# Patient Record
Sex: Female | Born: 1950 | Race: White | Hispanic: No | Marital: Married | Smoking: Never smoker
Health system: Southern US, Community
[De-identification: ages and names within clinical notes are randomized; demographics above are authoritative.]

## PROBLEM LIST (undated history)

## (undated) DIAGNOSIS — R112 Nausea with vomiting, unspecified: Secondary | ICD-10-CM

## (undated) DIAGNOSIS — Z973 Presence of spectacles and contact lenses: Secondary | ICD-10-CM

## (undated) DIAGNOSIS — Z9889 Other specified postprocedural states: Secondary | ICD-10-CM

## (undated) DIAGNOSIS — L570 Actinic keratosis: Secondary | ICD-10-CM

## (undated) DIAGNOSIS — E119 Type 2 diabetes mellitus without complications: Secondary | ICD-10-CM

## (undated) DIAGNOSIS — N840 Polyp of corpus uteri: Secondary | ICD-10-CM

## (undated) DIAGNOSIS — N950 Postmenopausal bleeding: Secondary | ICD-10-CM

## (undated) DIAGNOSIS — E785 Hyperlipidemia, unspecified: Secondary | ICD-10-CM

## (undated) HISTORY — DX: Hyperlipidemia, unspecified: E78.5

## (undated) HISTORY — DX: Actinic keratosis: L57.0

---

## 1978-03-29 HISTORY — PX: LAPAROSCOPY: SHX197

## 2000-08-24 ENCOUNTER — Other Ambulatory Visit: Admission: RE | Admit: 2000-08-24 | Discharge: 2000-08-24 | Payer: Self-pay | Admitting: Internal Medicine

## 2000-09-27 LAB — HM COLONOSCOPY: HM Colonoscopy: NORMAL

## 2003-02-15 ENCOUNTER — Other Ambulatory Visit: Admission: RE | Admit: 2003-02-15 | Discharge: 2003-02-15 | Payer: Self-pay | Admitting: Internal Medicine

## 2004-06-10 ENCOUNTER — Ambulatory Visit: Payer: Self-pay | Admitting: Internal Medicine

## 2004-12-04 ENCOUNTER — Ambulatory Visit: Payer: Self-pay | Admitting: Internal Medicine

## 2005-02-04 ENCOUNTER — Ambulatory Visit: Payer: Self-pay | Admitting: Family Medicine

## 2006-06-16 ENCOUNTER — Encounter: Payer: Self-pay | Admitting: Internal Medicine

## 2006-06-16 ENCOUNTER — Ambulatory Visit: Payer: Self-pay | Admitting: Internal Medicine

## 2006-06-16 ENCOUNTER — Other Ambulatory Visit: Admission: RE | Admit: 2006-06-16 | Discharge: 2006-06-16 | Payer: Self-pay | Admitting: Internal Medicine

## 2006-06-16 LAB — CONVERTED CEMR LAB
Cholesterol: 175 mg/dL (ref 0–200)
HDL: 32.8 mg/dL — ABNORMAL LOW (ref >39.0)
Triglycerides: 136 mg/dL (ref 0–149)
VLDL: 27 mg/dL (ref 0–40)

## 2006-06-21 ENCOUNTER — Ambulatory Visit: Payer: Self-pay | Admitting: Family Medicine

## 2006-07-01 ENCOUNTER — Ambulatory Visit: Payer: Self-pay | Admitting: Internal Medicine

## 2006-07-01 ENCOUNTER — Encounter: Payer: Self-pay | Admitting: Internal Medicine

## 2006-07-01 DIAGNOSIS — N809 Endometriosis, unspecified: Secondary | ICD-10-CM | POA: Insufficient documentation

## 2008-05-09 ENCOUNTER — Ambulatory Visit: Payer: Self-pay | Admitting: Family Medicine

## 2008-05-09 DIAGNOSIS — H8110 Benign paroxysmal vertigo, unspecified ear: Secondary | ICD-10-CM

## 2008-11-01 ENCOUNTER — Ambulatory Visit: Payer: Self-pay | Admitting: Internal Medicine

## 2008-11-01 DIAGNOSIS — E785 Hyperlipidemia, unspecified: Secondary | ICD-10-CM | POA: Insufficient documentation

## 2008-11-04 LAB — CONVERTED CEMR LAB
ALT: 19 units/L (ref 0–35)
Albumin: 4 g/dL (ref 3.5–5.2)
BUN: 11 mg/dL (ref 6–23)
Basophils Absolute: 0 10*3/uL (ref 0.0–0.1)
Basophils Relative: 0.2 % (ref 0.0–3.0)
Bilirubin, Direct: 0.2 mg/dL (ref 0.0–0.3)
Chloride: 108 meq/L (ref 96–112)
Cholesterol: 188 mg/dL (ref 0–200)
Eosinophils Absolute: 0.1 10*3/uL (ref 0.0–0.7)
Eosinophils Relative: 1.9 % (ref 0.0–5.0)
HCT: 41.9 % (ref 36.0–46.0)
HDL: 43.7 mg/dL (ref >39.00)
Hemoglobin: 14.2 g/dL (ref 12.0–15.0)
Hgb A1c MFr Bld: 5.8 % (ref 4.6–6.5)
LDL Cholesterol: 126 mg/dL — ABNORMAL HIGH (ref 0–99)
MCV: 92.4 fL (ref 78.0–100.0)
Monocytes Relative: 7.8 % (ref 3.0–12.0)
Neutro Abs: 4.4 10*3/uL (ref 1.4–7.7)
Phosphorus: 2.7 mg/dL (ref 2.3–4.6)
Platelets: 251 10*3/uL (ref 150.0–400.0)
Potassium: 4.3 meq/L (ref 3.5–5.1)
RBC: 4.53 M/uL (ref 3.87–5.11)
Sodium: 144 meq/L (ref 135–145)
Total Protein: 7 g/dL (ref 6.0–8.3)
Triglycerides: 91 mg/dL (ref 0.0–149.0)
VLDL: 18.2 mg/dL (ref 0.0–40.0)
WBC: 7.1 10*3/uL (ref 4.5–10.5)

## 2008-11-05 ENCOUNTER — Encounter: Payer: Self-pay | Admitting: Internal Medicine

## 2008-11-21 ENCOUNTER — Encounter: Payer: Self-pay | Admitting: Internal Medicine

## 2009-06-06 ENCOUNTER — Ambulatory Visit: Payer: Self-pay | Admitting: Internal Medicine

## 2009-06-06 DIAGNOSIS — H66009 Acute suppurative otitis media without spontaneous rupture of ear drum, unspecified ear: Secondary | ICD-10-CM | POA: Insufficient documentation

## 2009-12-25 ENCOUNTER — Telehealth: Payer: Self-pay | Admitting: Internal Medicine

## 2010-04-28 NOTE — Progress Notes (Signed)
Summary: exposed to scabies  Phone Note Call from Patient Call back at [phone]   Caller: Patient Call For: Cindee Salt MD Summary of Call: Pt states her grandson, who lives with her, was dx'd with scabies today and his doctor told her all the family would need to be treated.  Uses [facility] [address]  Please also see phone notes on her husband and son. Initial call taken by: Lowella Petties CMA,  December 25, 2009 3:34 PM  Follow-up for Phone Call        okay   Rx sent  please make sure they wash all clothes, linens and towels in hot water the day after they do the treatment Follow-up by: Cindee Salt MD,  December 25, 2009 3:41 PM  Additional Follow-up for Phone Call Additional follow up Details #1::        Patient notified as instructed via telephone.  Rx sent to pharmacy. Additional Follow-up by: Linde Gillis CMA Duncan Dull),  December 25, 2009 5:10 PM    New/Updated Medications: PERMETHRIN 5 % CREA (PERMETHRIN) apply to entire body from neck down in evening and wash off the next day Prescriptions: PERMETHRIN 5 % CREA (PERMETHRIN) apply to entire body from neck down in evening and wash off the next day  #1 bottle x 0   Entered and Authorized by:   Cindee Salt MD   Signed by:   Cindee Salt MD on 12/25/2009   Method used:   Electronically to        [facility]. [address] [phone]* (retail)       [address] [place], [place]  [id number]       Ph: [phone]       Fax: [phone]   RxID:   [id number]

## 2010-04-28 NOTE — Assessment & Plan Note (Signed)
Summary: EAR ACHE/DLO   Vital Signs:  Patient profile:   60 year old female Weight:      183 pounds Temp:     98.2 degrees F oral BP sitting:   128 / 78  (left arm) Cuff size:   regular  Vitals Entered By: Mervin Hack CMA Duncan Dull) (June 06, 2009 12:26 PM) CC: right ear pain   History of Present Illness: Awoke a week ago with bad sore throat 4 days ago-only left side of throat hurt and voice went next day, pain to right side and up into ear draining--looks like wax  Pain is intermittent now--sharp some ringing still Hearing is really off there  Throat is now better--slight discomfort No sig head congestion Fever on first day only  sporadic cough No SOB  Allergies: No Known Drug Allergies  Past History:  Past medical, surgical, family and social histories (including risk factors) reviewed for relevance to current acute and chronic problems.  Past Medical History: Reviewed history from 11/01/2008 and no changes required. Endometriosis Impaired fasting glucose Hyperlipidemia  Past Surgical History: Reviewed history from 11/01/2008 and no changes required. VD x 2 Laparascopy --1980's  Family History: Reviewed history from 11/01/2008 and no changes required. Dad had  DM and HTN. Died of ?melanoma Mom with ulcers. Skin cancer Mat GM died of stroke in her 70's Mat GF died of MI in his 11's No breast cancer ??sister with colon cancer (in polyp)  Social History: Reviewed history from 11/01/2008 and no changes required. Married---2 children---1 son and 1 daughter Never Smoked Alcohol use-occ Occupation: Chief Financial Officer for [employer]  Review of Systems       No vomiting or diarrhea appetite only off the first day  Physical Exam  General:  alert.  NAD Head:  no sinus tenderness Ears:  L ear normal.   small canals--right TM has inflammation and dullness posteriorly Nose:  mild congestion with yellow mucus Mouth:  no erythema and no exudates.     Neck:  supple, no masses, and no cervical lymphadenopathy.   Lungs:  normal respiratory effort and normal breath sounds.     Impression & Recommendations:  Problem # 1:  ACUT SUPPRATV OTITIS MEDIA W/O SPONT RUP EARDRUM (ICD-382.00) Assessment New  discussed analgesics amoxicillin will change to augmentin if persists  Her updated medication list for this problem includes:    Amoxicillin 500 Mg Tabs (Amoxicillin) .Marland Kitchen... 2 tabs by mouth two times a day for ear infection  Complete Medication List: 1)  Amoxicillin 500 Mg Tabs (Amoxicillin) .... 2 tabs by mouth two times a day for ear infection  Patient Instructions: 1)  Please schedule a follow-up appointment as needed .  Prescriptions: AMOXICILLIN 500 MG TABS (AMOXICILLIN) 2 tabs by mouth two times a day for ear infection  #40 x 0   Entered and Authorized by:   Cindee Salt MD   Signed by:   Cindee Salt MD on 06/06/2009   Method used:   Electronically to        [facility]. [address] [phone]* (retail)       [address] [place], [place]  [id number]       Ph: [phone]       Fax: [phone]   RxID:   [id number]   Prior Medications: Current Allergies (reviewed today): No known allergies

## 2010-12-18 ENCOUNTER — Encounter: Payer: Self-pay | Admitting: Internal Medicine

## 2010-12-24 ENCOUNTER — Other Ambulatory Visit (HOSPITAL_COMMUNITY)
Admission: RE | Admit: 2010-12-24 | Discharge: 2010-12-24 | Disposition: A | Discharge status: Home / Self Care | Payer: 59 | Source: Ambulatory Visit | Attending: Internal Medicine | Admitting: Internal Medicine

## 2010-12-24 ENCOUNTER — Encounter: Payer: Self-pay | Admitting: Internal Medicine

## 2010-12-24 ENCOUNTER — Ambulatory Visit (INDEPENDENT_AMBULATORY_CARE_PROVIDER_SITE_OTHER): Payer: 59 | Admitting: Internal Medicine

## 2010-12-24 DIAGNOSIS — Z0000 Encounter for general adult medical examination without abnormal findings: Secondary | ICD-10-CM | POA: Insufficient documentation

## 2010-12-24 DIAGNOSIS — R7301 Impaired fasting glucose: Secondary | ICD-10-CM

## 2010-12-24 DIAGNOSIS — Z23 Encounter for immunization: Secondary | ICD-10-CM

## 2010-12-24 DIAGNOSIS — Z1231 Encounter for screening mammogram for malignant neoplasm of breast: Secondary | ICD-10-CM

## 2010-12-24 DIAGNOSIS — Z1211 Encounter for screening for malignant neoplasm of colon: Secondary | ICD-10-CM

## 2010-12-24 DIAGNOSIS — Z01419 Encounter for gynecological examination (general) (routine) without abnormal findings: Secondary | ICD-10-CM | POA: Insufficient documentation

## 2010-12-24 NOTE — Progress Notes (Signed)
Subjective:    Patient ID: Isabel Johnson, female    DOB: 12-Dec-1950, 60 y.o.   MRN: [id number]  HPI Hasn't been here in 2 years Due for mammo, Pap and colon Doesn't remember tetanus booster  No new concerns Still works at [employer] fine with family Son and grandson still living with them  No current outpatient prescriptions on file prior to visit.    No Known Allergies  Past Medical History  Diagnosis Date  . Endometriosis   . Impaired fasting glucose   . Hyperlipidemia     Past Surgical History  Procedure Date  . Vaginal delivery     x2  . Laparoscopy 1980's    Family History  Problem Relation Age of Onset  . Diabetes Father   . Hypertension Father   . Stroke Maternal Grandmother   . Heart disease Maternal Grandfather   . Cancer Neg Hx     History   Social History  . Marital Status: Single    Spouse Name: N/A    Number of Children: 2  . 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 Smoking status: Never Smoker   . Smokeless tobacco: Never Used  . Alcohol Use: Yes  . Drug Use: No  . Sexually Active: Not on file   Other Topics Concern  . Not on file   Social History Narrative  . No narrative on file   Review of Systems  Constitutional: Positive for unexpected weight change.       Weight is back up 153 or so No time for exercise with caring son and grandson, etc Wears seat belt  HENT: Positive for hearing loss and tinnitus. Negative for congestion and rhinorrhea.        Chronic hearing problems and tinnitus--considering evaluation for hearing aide Regular with dentist  Eyes: Negative for visual disturbance.       No diplopia or unilateral vision loss  Respiratory: Negative for cough, chest tightness and shortness of breath.   Cardiovascular: Positive for leg swelling. Negative for chest pain and palpitations.       Occ mild edema--esp in heat    Gastrointestinal: Negative for nausea, vomiting, abdominal pain, constipation and blood in stool.  Genitourinary: Positive for urgency. Negative for dysuria and difficulty urinating.       She and husband have both lost libido  Musculoskeletal: Positive for back pain and arthralgias. Negative for joint swelling.       Has to be careful lifting grandson occ knee pain  Skin: Negative for rash.       No suspicious lesions  Neurological: Negative for dizziness, syncope, weakness, light-headedness, numbness and headaches.  Hematological: Negative for adenopathy. Bruises/bleeds easily.  Psychiatric/Behavioral: Negative for sleep disturbance and dysphoric mood. The patient is not nervous/anxious.        Objective:   Physical Exam  Constitutional: She is oriented to person, place, and time. She appears well-developed and well-nourished. No distress.  HENT:  Head: Normocephalic and atraumatic.  Right Ear: External ear normal.  Left Ear: External ear normal.  Mouth/Throat: Oropharynx is clear and moist. No oropharyngeal exudate.       TMs normal  Eyes: Conjunctivae and EOM are normal. Pupils are equal, round, and reactive to light.       Fundi benign  Neck: Normal range of motion. Neck supple. No thyromegaly present.  Cardiovascular: Normal rate, regular rhythm,  normal heart sounds and intact distal pulses.  Exam reveals no gallop.   No murmur heard. Pulmonary/Chest: Effort normal and breath sounds normal. No respiratory distress. She has no wheezes. She has no rales.  Abdominal: Soft. She exhibits no mass. There is no tenderness.  Genitourinary:       Dense breasts with mild cystic changes  Prominent urethra at introitus Uterus/cervix normal Pap done No adnexal mass or tenderness  Musculoskeletal: Normal range of motion. She exhibits no edema and no tenderness.  Lymphadenopathy:    She has no cervical adenopathy.    She has no axillary adenopathy.  Neurological: She is alert and  oriented to person, place, and time. She exhibits normal muscle tone.       Normal strength and gait  Skin: Skin is warm. No rash noted.  Psychiatric: She has a normal mood and affect. Her behavior is normal. Judgment and thought content normal.          Assessment & Plan:

## 2010-12-24 NOTE — Assessment & Plan Note (Signed)
Will check FBS soon

## 2010-12-24 NOTE — Patient Instructions (Addendum)
Please set up fasting blood sugar soon (790.21) Set up mammogram and colonoscopy

## 2010-12-24 NOTE — Assessment & Plan Note (Signed)
Fairly healthy but out of shape Stress with son and grandson in the house--gets up early to bring him to daycare, etc Will try to start walking  Due for mammo and colon Tdap today

## 2010-12-29 ENCOUNTER — Other Ambulatory Visit: Payer: Self-pay | Admitting: Internal Medicine

## 2010-12-29 DIAGNOSIS — R7301 Impaired fasting glucose: Secondary | ICD-10-CM

## 2010-12-30 ENCOUNTER — Encounter: Payer: Self-pay | Admitting: *Deleted

## 2010-12-30 ENCOUNTER — Other Ambulatory Visit (INDEPENDENT_AMBULATORY_CARE_PROVIDER_SITE_OTHER): Payer: 59

## 2010-12-30 DIAGNOSIS — R7301 Impaired fasting glucose: Secondary | ICD-10-CM

## 2010-12-30 LAB — GLUCOSE, RANDOM: Glucose, Bld: 139 mg/dL — ABNORMAL HIGH (ref 70–99)

## 2010-12-31 ENCOUNTER — Other Ambulatory Visit: Payer: Self-pay

## 2011-01-18 ENCOUNTER — Ambulatory Visit (AMBULATORY_SURGERY_CENTER): Payer: 59

## 2011-01-18 ENCOUNTER — Encounter: Payer: Self-pay | Admitting: Internal Medicine

## 2011-01-18 VITALS — Ht 67.0 in | Wt 192.8 lb

## 2011-01-18 DIAGNOSIS — Z1211 Encounter for screening for malignant neoplasm of colon: Secondary | ICD-10-CM

## 2011-01-18 MED ORDER — PEG-KCL-NACL-NASULF-NA ASC-C 100 G PO SOLR: 1.0000 | Freq: Once | ORAL | Status: DC

## 2011-01-25 ENCOUNTER — Encounter: Payer: Self-pay | Admitting: Internal Medicine

## 2011-01-25 ENCOUNTER — Ambulatory Visit (AMBULATORY_SURGERY_CENTER): Payer: 59 | Admitting: Internal Medicine

## 2011-01-25 VITALS — BP 131/57 | HR 68 | Temp 98.2°F | Resp 17 | Ht 67.0 in | Wt 192.0 lb

## 2011-01-25 DIAGNOSIS — Z1211 Encounter for screening for malignant neoplasm of colon: Secondary | ICD-10-CM

## 2011-01-25 DIAGNOSIS — Z800 Family history of malignant neoplasm of digestive organs: Secondary | ICD-10-CM

## 2011-01-25 DIAGNOSIS — D126 Benign neoplasm of colon, unspecified: Secondary | ICD-10-CM

## 2011-01-25 MED ORDER — SODIUM CHLORIDE 0.9 % IV SOLN: 500.0000 mL | INTRAVENOUS | Status: DC

## 2011-01-25 NOTE — Patient Instructions (Signed)
Please review discharge instructions (blue and green sheets)  Await pathology  Resume regular medications

## 2011-01-26 ENCOUNTER — Telehealth: Payer: Self-pay | Admitting: *Deleted

## 2011-01-26 NOTE — Telephone Encounter (Signed)
Follow up Call- Patient questions:  Do you have a fever, pain , or abdominal swelling? no Pain Score  0 *  Have you tolerated food without any problems? yes  Have you been able to return to your normal activities? yes  Do you have any questions about your discharge instructions: Diet   no Medications  no Follow up visit  no  Do you have questions or concerns about your Care? no  Actions: * If pain score is 4 or above: No action needed, pain <4. Patient stating she has had a sinus infection which makes have a decreased appetite. No problems related to Colonoscopy.

## 2011-01-29 ENCOUNTER — Other Ambulatory Visit (INDEPENDENT_AMBULATORY_CARE_PROVIDER_SITE_OTHER): Payer: 59

## 2011-01-29 DIAGNOSIS — R7301 Impaired fasting glucose: Secondary | ICD-10-CM

## 2011-02-02 ENCOUNTER — Telehealth: Payer: Self-pay | Admitting: *Deleted

## 2011-02-02 ENCOUNTER — Ambulatory Visit: Payer: Self-pay | Admitting: Internal Medicine

## 2011-02-02 NOTE — Telephone Encounter (Signed)
Patient called back and I advised results 

## 2011-02-02 NOTE — Telephone Encounter (Signed)
Message copied by Sueanne Margarita on Tue Feb 02, 2011 12:52 PM ------      Message from: Tillman Abide I      Created: Fri Jan 29, 2011  2:00 PM       Please call      The sugar is back down to 118 and the long term sugar test is still in the normal range      Fortunately, this means she doesn't have diabetes now      She needs to stick with proper eating and regular exercise to keep from becoming diabetic though

## 2011-02-02 NOTE — Telephone Encounter (Signed)
.  left message to have patient return my call.  

## 2011-02-08 ENCOUNTER — Encounter: Payer: Self-pay | Admitting: *Deleted

## 2011-12-13 ENCOUNTER — Ambulatory Visit (INDEPENDENT_AMBULATORY_CARE_PROVIDER_SITE_OTHER): Payer: 59 | Admitting: Internal Medicine

## 2011-12-13 ENCOUNTER — Encounter: Payer: Self-pay | Admitting: Internal Medicine

## 2011-12-13 VITALS — BP 140/60 | HR 86 | Temp 98.0°F | Ht 67.0 in | Wt 196.0 lb

## 2011-12-13 DIAGNOSIS — J209 Acute bronchitis, unspecified: Secondary | ICD-10-CM

## 2011-12-13 MED ORDER — HYDROCODONE-HOMATROPINE 5-1.5 MG/5ML PO SYRP: 5.0000 mL | ORAL_SOLUTION | Freq: Every evening | ORAL | Status: DC | PRN

## 2011-12-13 MED ORDER — AZITHROMYCIN 250 MG PO TABS: ORAL_TABLET | ORAL | Status: DC

## 2011-12-13 NOTE — Assessment & Plan Note (Signed)
Clearly started as viral but may have secondary bacterial infection Will Rx Z-pak  Cough med

## 2011-12-13 NOTE — Progress Notes (Signed)
  Subjective:    Patient ID: Isabel Johnson, female    DOB: 10-17-1950, 61 y.o.   MRN: [id number]  HPI Has had cough for about a week Scratchy cough at first Appetite is off Some fever 2 days ago---better briefly with tylenol Some cold sweats  Mucus is hung in throat Some SOB at times--like after bending down to get something from floor Hasn't missed work  No head congestion Mild nasal drainage--clear Some headache from coughing No ear pain  No current outpatient prescriptions on file prior to visit.    No Known Allergies  Past Medical History  Diagnosis Date  . Endometriosis   . Impaired fasting glucose   . Hyperlipidemia     Past Surgical History  Procedure Date  . Vaginal delivery     x2  . Laparoscopy 1980's    Family History  Problem Relation Age of Onset  . Diabetes Father   . Hypertension Father   . Stroke Maternal Grandmother   . Heart disease Maternal Grandfather   . Cancer Neg Hx   . Colon cancer Sister     History   Social History  . Marital Status: Single    Spouse Name: N/A    Number of Children: 2  . 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 Smoking status: Never Smoker   . Smokeless tobacco: Never Used  . Alcohol Use: Yes  . Drug Use: No  . Sexually Active: Not on file   Other Topics Concern  . Not on file   Social History Narrative  . No narrative on file   Review of Systems Some nausea No vomiting Appetite off but able to eat some Cough keeping her up at night     Objective:   Physical Exam  Constitutional: She appears well-developed and well-nourished. No distress.       Coarse cough  HENT:  Mouth/Throat: Oropharynx is clear and moist. No oropharyngeal exudate.       No sinus tenderness TMs partially obscured with cerumen--look okay  Neck: Normal range of motion. Neck supple. No thyromegaly present.  Pulmonary/Chest: Effort normal. No  respiratory distress. She has no wheezes. She has no rales.       Generalized rhonchi  Lymphadenopathy:    She has no cervical adenopathy.          Assessment & Plan:

## 2011-12-28 ENCOUNTER — Encounter: Payer: Self-pay | Admitting: Internal Medicine

## 2011-12-28 ENCOUNTER — Ambulatory Visit (INDEPENDENT_AMBULATORY_CARE_PROVIDER_SITE_OTHER): Payer: 59 | Admitting: Internal Medicine

## 2011-12-28 VITALS — BP 128/68 | HR 68 | Temp 97.7°F | Ht 67.0 in | Wt 193.0 lb

## 2011-12-28 DIAGNOSIS — Z23 Encounter for immunization: Secondary | ICD-10-CM

## 2011-12-28 DIAGNOSIS — E785 Hyperlipidemia, unspecified: Secondary | ICD-10-CM

## 2011-12-28 DIAGNOSIS — Z0000 Encounter for general adult medical examination without abnormal findings: Secondary | ICD-10-CM

## 2011-12-28 DIAGNOSIS — R7301 Impaired fasting glucose: Secondary | ICD-10-CM

## 2011-12-28 LAB — CBC WITH DIFFERENTIAL/PLATELET
Basophils Absolute: 0.1 10*3/uL (ref 0.0–0.1)
Eosinophils Relative: 1.6 % (ref 0.0–5.0)
HCT: 40.2 % (ref 36.0–46.0)
Hemoglobin: 14 g/dL (ref 12.0–15.0)
Lymphocytes Relative: 32.4 % (ref 12.0–46.0)
Lymphs Abs: 2.3 10*3/uL (ref 0.7–4.0)
Monocytes Relative: 6.7 % (ref 3.0–12.0)
Neutro Abs: 4.1 10*3/uL (ref 1.4–7.7)
RDW: 12.7 % (ref 11.5–14.6)
WBC: 7 10*3/uL (ref 4.5–10.5)

## 2011-12-28 LAB — BASIC METABOLIC PANEL
Calcium: 8.8 mg/dL (ref 8.4–10.5)
GFR: 88.85 mL/min (ref >60.00)
Potassium: 3.7 mEq/L (ref 3.5–5.1)
Sodium: 136 mEq/L (ref 135–145)

## 2011-12-28 LAB — LIPID PANEL
HDL: 40.6 mg/dL (ref >39.00)
LDL Cholesterol: 130 mg/dL — ABNORMAL HIGH (ref 0–99)
Total CHOL/HDL Ratio: 5
VLDL: 21.2 mg/dL (ref 0.0–40.0)

## 2011-12-28 LAB — HEPATIC FUNCTION PANEL
Bilirubin, Direct: 0.1 mg/dL (ref 0.0–0.3)
Total Bilirubin: 1.4 mg/dL — ABNORMAL HIGH (ref 0.3–1.2)

## 2011-12-28 LAB — TSH: TSH: 1.07 u[IU]/mL (ref 0.35–5.50)

## 2011-12-28 NOTE — Patient Instructions (Signed)
Please check into the insurance coverage for the shingles vaccine (zostavax)

## 2011-12-28 NOTE — Progress Notes (Signed)
Subjective:    Patient ID: Isabel Johnson, female    DOB: [birth year], 61 y.o.   MRN: [id number]  HPI Here for physical No new concerns  Still has son and grandson living with her This is stressful Has gained even a few more pounds----now "on the way back down". Has lost some weight again Eating more healthy now  No current outpatient prescriptions on file prior to visit.    No Known Allergies  Past Medical History  Diagnosis Date  . Endometriosis   . Impaired fasting glucose   . Hyperlipidemia     Past Surgical History  Procedure Date  . Vaginal delivery     x2  . Laparoscopy 1980's    Family History  Problem Relation Age of Onset  . Diabetes Father   . Hypertension Father   . Stroke Maternal Grandmother   . Heart disease Maternal Grandfather   . Cancer Neg Hx   . Colon cancer Sister     History   Social History  . Marital Status: Single    Spouse Name: N/A    Number of Children: 2  . 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 Smoking status: Never Smoker   . Smokeless tobacco: Never Used  . Alcohol Use: Yes  . Drug Use: No  . Sexually Active: Not on file   Other Topics Concern  . Not on file   Social History Narrative  . No narrative on file   Review of Systems  Constitutional: Negative for fatigue.       Wears seat belt  HENT: Positive for hearing loss and tinnitus. Negative for congestion, rhinorrhea and dental problem.        Decreased hearing in right ear Regular with dentist  Eyes: Negative for visual disturbance.       No diplopia or unilateral vision loss  Respiratory: Negative for cough, chest tightness and shortness of breath.   Cardiovascular: Negative for chest pain, palpitations and leg swelling.  Gastrointestinal: Positive for diarrhea. Negative for nausea, vomiting, abdominal pain, constipation and blood in stool.       No heartburn Occ  diarrhea---"everything goes right through me"----immodium helps  Genitourinary: Positive for urgency. Negative for dysuria and difficulty urinating.       No sex --no problem  Musculoskeletal: Positive for arthralgias. Negative for back pain and joint swelling.       Right hand pain at times  Skin: Negative for rash.       No suspicious leisons--- sees Dr Purcell Nails prn  Neurological: Negative for dizziness, syncope, weakness, light-headedness, numbness and headaches.  Hematological: Negative for adenopathy. Does not bruise/bleed easily.  Psychiatric/Behavioral: Positive for disturbed wake/sleep cycle. Negative for dysphoric mood. The patient is not nervous/anxious.        Ongoing sleep problems--rarely sleeps all night Does have mid afternoon lull---but no striking somnolence. Snores but no apnea, snorting or gasping       Objective:   Physical Exam  Constitutional: She is oriented to person, place, and time. She appears well-developed and well-nourished. No distress.  HENT:  Head: Normocephalic and atraumatic.  Right Ear: External ear normal.  Left Ear: External ear normal.  Mouth/Throat: Oropharynx is clear and moist. No oropharyngeal exudate.  Eyes: Conjunctivae normal and EOM are normal. Pupils are equal, round, and reactive to light.  Neck: Normal range of motion. Neck supple. No thyromegaly present.  Cardiovascular:  Normal rate, regular rhythm, normal heart sounds and intact distal pulses.  Exam reveals no gallop.   No murmur heard. Pulmonary/Chest: Effort normal and breath sounds normal. No respiratory distress. She has no wheezes. She has no rales.  Abdominal: Soft. There is no tenderness.  Genitourinary:       No breast masses or tenderness  Musculoskeletal: She exhibits no edema and no tenderness.  Lymphadenopathy:    She has no cervical adenopathy.  Neurological: She is alert and oriented to person, place, and time.  Skin: No rash noted. No erythema.  Psychiatric: She  has a normal mood and affect. Her behavior is normal. Thought content normal.          Assessment & Plan:

## 2011-12-28 NOTE — Assessment & Plan Note (Signed)
Will check labs Needs consistent healthy behaviors Counseling if diabetes diagnosis

## 2011-12-28 NOTE — Addendum Note (Signed)
Addended by: Sueanne Margarita on: 12/28/2011 09:01 AM   Modules accepted: Orders

## 2011-12-28 NOTE — Assessment & Plan Note (Signed)
Healthy but out of shape Discussed fitness She will look into zostavax mammo every other year---due 2014

## 2011-12-28 NOTE — Assessment & Plan Note (Signed)
Mildly elevated LDL No Rx unless diabetic

## 2012-01-05 ENCOUNTER — Encounter: Payer: Self-pay | Admitting: *Deleted

## 2013-02-01 ENCOUNTER — Other Ambulatory Visit: Payer: Self-pay

## 2013-06-26 ENCOUNTER — Telehealth: Payer: Self-pay

## 2013-06-26 NOTE — Telephone Encounter (Signed)
Let her know that I really don't recommend the patch---it causes a fair number of side effects (like dry mouth, occasionally confusion, etc) It is safer to take meclizine 25mg  tid as needed This is available OTC but you can send her an Rx for #60 x 0 if she wants (it may be cheaper that way)

## 2013-06-26 NOTE — Telephone Encounter (Signed)
Pt left v/m; pt going on 7 day cruise this weekend and request patch for motion sickness; pt has hx of vertigo; pt has never used the patch. Pt last seen 12/28/2011.[place].Please advise. Pt request cb when med sent to pharmacy.

## 2013-06-26 NOTE — Telephone Encounter (Signed)
Left message on machine with results, advised pt to call if any questions

## 2014-03-08 ENCOUNTER — Other Ambulatory Visit (HOSPITAL_COMMUNITY)
Admission: RE | Admit: 2014-03-08 | Discharge: 2014-03-08 | Disposition: A | Discharge status: Home / Self Care | Payer: 59 | Source: Ambulatory Visit | Attending: Internal Medicine | Admitting: Internal Medicine

## 2014-03-08 ENCOUNTER — Encounter: Payer: Self-pay | Admitting: Internal Medicine

## 2014-03-08 ENCOUNTER — Ambulatory Visit (INDEPENDENT_AMBULATORY_CARE_PROVIDER_SITE_OTHER): Payer: 59 | Admitting: Internal Medicine

## 2014-03-08 VITALS — BP 138/78 | HR 79 | Temp 97.5°F | Ht 67.0 in | Wt 193.0 lb

## 2014-03-08 DIAGNOSIS — R1319 Other dysphagia: Secondary | ICD-10-CM

## 2014-03-08 DIAGNOSIS — R7301 Impaired fasting glucose: Secondary | ICD-10-CM

## 2014-03-08 DIAGNOSIS — Z1151 Encounter for screening for human papillomavirus (HPV): Secondary | ICD-10-CM | POA: Insufficient documentation

## 2014-03-08 DIAGNOSIS — Z01419 Encounter for gynecological examination (general) (routine) without abnormal findings: Secondary | ICD-10-CM | POA: Insufficient documentation

## 2014-03-08 DIAGNOSIS — Z23 Encounter for immunization: Secondary | ICD-10-CM

## 2014-03-08 DIAGNOSIS — Z0000 Encounter for general adult medical examination without abnormal findings: Secondary | ICD-10-CM

## 2014-03-08 DIAGNOSIS — R1314 Dysphagia, pharyngoesophageal phase: Secondary | ICD-10-CM

## 2014-03-08 DIAGNOSIS — R1310 Dysphagia, unspecified: Secondary | ICD-10-CM | POA: Insufficient documentation

## 2014-03-08 LAB — COMPREHENSIVE METABOLIC PANEL
ALBUMIN: 4.2 g/dL (ref 3.5–5.2)
ALK PHOS: 77 U/L (ref 39–117)
ALT: 33 U/L (ref 0–35)
AST: 31 U/L (ref 0–37)
BUN: 11 mg/dL (ref 6–23)
CO2: 26 mEq/L (ref 19–32)
Calcium: 9.5 mg/dL (ref 8.4–10.5)
Chloride: 99 mEq/L (ref 96–112)
Creatinine, Ser: 0.8 mg/dL (ref 0.4–1.2)
GFR: 81.56 mL/min (ref >60.00)
Glucose, Bld: 223 mg/dL — ABNORMAL HIGH (ref 70–99)
POTASSIUM: 3.9 meq/L (ref 3.5–5.1)
SODIUM: 135 meq/L (ref 135–145)
Total Bilirubin: 1.1 mg/dL (ref 0.2–1.2)
Total Protein: 7.5 g/dL (ref 6.0–8.3)

## 2014-03-08 LAB — CBC WITH DIFFERENTIAL/PLATELET
BASOS ABS: 0.1 10*3/uL (ref 0.0–0.1)
BASOS PCT: 0.5 % (ref 0.0–3.0)
EOS ABS: 0.2 10*3/uL (ref 0.0–0.7)
Eosinophils Relative: 1.6 % (ref 0.0–5.0)
HCT: 46.3 % — ABNORMAL HIGH (ref 36.0–46.0)
Hemoglobin: 15.1 g/dL — ABNORMAL HIGH (ref 12.0–15.0)
LYMPHS PCT: 31.1 % (ref 12.0–46.0)
Lymphs Abs: 3.3 10*3/uL (ref 0.7–4.0)
MCHC: 32.6 g/dL (ref 30.0–36.0)
MCV: 90.9 fl (ref 78.0–100.0)
MONO ABS: 0.7 10*3/uL (ref 0.1–1.0)
Monocytes Relative: 6.2 % (ref 3.0–12.0)
NEUTROS PCT: 60.6 % (ref 43.0–77.0)
Neutro Abs: 6.5 10*3/uL (ref 1.4–7.7)
PLATELETS: 324 10*3/uL (ref 150.0–400.0)
RBC: 5.1 Mil/uL (ref 3.87–5.11)
RDW: 12.8 % (ref 11.5–15.5)
WBC: 10.7 10*3/uL — ABNORMAL HIGH (ref 4.0–10.5)

## 2014-03-08 LAB — HEMOGLOBIN A1C: HEMOGLOBIN A1C: 9.4 % — AB (ref 4.6–6.5)

## 2014-03-08 LAB — T4, FREE: Free T4: 0.89 ng/dL (ref 0.60–1.60)

## 2014-03-08 MED ORDER — OMEPRAZOLE 20 MG PO CPDR: 20.0000 mg | DELAYED_RELEASE_CAPSULE | Freq: Every day | ORAL | Status: DC

## 2014-03-08 NOTE — Assessment & Plan Note (Signed)
Healthy but still out of shape Discussed fitness and proper eating Pap done today Due for mammogram Flu vaccine today

## 2014-03-08 NOTE — Assessment & Plan Note (Signed)
Rare but also some indigestion symptoms Will have her start PPI and let me know if her symptoms don't resolve

## 2014-03-08 NOTE — Progress Notes (Signed)
Subjective:    Patient ID: Isabel Johnson, female    DOB: 11-14-1950, 63 y.o.   MRN: [id number]  HPI Here for physical Hasn't been seen for 2 years  No new problems Still has son and 51 year old grandson living with them  Intermittently eats better and tries to walk Can't consistently keep her weight down  Gets food stuck in throat at times Once a month--no particular food  No heartburn or water brash--but does get epigastric pain at times. TUMs doesn't relieve. Lasts for 30 minutes or so  Still gets diarrhea after eating No pain---just urgency Will try imodium--it helps Colonoscopy okay 3 years ago--just polyps  No current outpatient prescriptions on file prior to visit.   No current facility-administered medications on file prior to visit.    No Known Allergies  Past Medical History  Diagnosis Date  . Endometriosis   . Impaired fasting glucose   . Hyperlipidemia     Past Surgical History  Procedure Laterality Date  . Vaginal delivery      x2  . Laparoscopy  1980's    Family History  Problem Relation Age of Onset  . Diabetes Father   . Hypertension Father   . Stroke Maternal Grandmother   . Heart disease Maternal Grandfather   . Colon cancer Sister   . Cancer Sister     colon and breast cancer    History   Social History  . Marital Status: Single    Spouse Name: N/A    Number of Children: 2  . 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 Smoking status: Never Smoker   . Smokeless tobacco: Never Used  . Alcohol Use: Yes  . Drug Use: No  . Sexual Activity: Not on file   Other Topics Concern  . Not on file   Social History Narrative   Review of Systems  Constitutional: Negative for fatigue and unexpected weight change.       Wears seat belt  HENT: Positive for tinnitus. Negative for hearing loss.        Left tinnitus--has seen Dr Pryor Ochoa Regular with dentist    Eyes: Negative for visual disturbance.       No diplopia or unilateral vision loss  Respiratory: Negative for cough, chest tightness and shortness of breath.   Cardiovascular: Negative for chest pain, palpitations and leg swelling.  Gastrointestinal: Positive for diarrhea. Negative for nausea, vomiting and blood in stool.  Endocrine: Negative for polydipsia and polyuria.  Genitourinary: Negative for dysuria, hematuria, difficulty urinating and dyspareunia.  Musculoskeletal: Positive for arthralgias. Negative for back pain and joint swelling.       Mild knee stiffness and "creaking"  Skin: Negative for rash.       No suspicious lesions  Allergic/Immunologic: Negative for environmental allergies and immunocompromised state.  Neurological: Negative for dizziness, syncope, weakness, light-headedness, numbness and headaches.  Psychiatric/Behavioral: Positive for sleep disturbance. Negative for dysphoric mood. The patient is not nervous/anxious.        Frequent awakening Gets tired ~3PM but doesn't nap       Objective:   Physical Exam  Constitutional: She is oriented to person, place, and time. She appears well-developed and well-nourished. No distress.  HENT:  Head: Normocephalic and atraumatic.  Right Ear: External ear normal.  Left Ear: External ear normal.  Mouth/Throat: Oropharynx is clear and moist. No oropharyngeal exudate.  Eyes:  Conjunctivae and EOM are normal. Pupils are equal, round, and reactive to light.  Neck: Normal range of motion. Neck supple. No thyromegaly present.  Cardiovascular: Normal rate, regular rhythm, normal heart sounds and intact distal pulses.   No murmur heard. Pulmonary/Chest: Effort normal and breath sounds normal. No respiratory distress. She has no wheezes. She has no rales.  Abdominal: Soft. There is no tenderness.  Genitourinary:  Dense breasts without masses or lesions Normal introitus Some white vaginal discharge (discussed yeast Rx if  symptoms) Cervix normal--pap done Bimanual negative  Musculoskeletal: She exhibits no edema or tenderness.  Lymphadenopathy:    She has no cervical adenopathy.  Neurological: She is alert and oriented to person, place, and time.  Skin: No rash noted. No erythema.  Psychiatric: She has a normal mood and affect. Her behavior is normal.          Assessment & Plan:

## 2014-03-08 NOTE — Addendum Note (Signed)
Addended by: Despina Hidden on: 03/08/2014 11:28 AM   Modules accepted: Orders

## 2014-03-08 NOTE — Progress Notes (Signed)
Pre visit review using our clinic review tool, if applicable. No additional management support is needed unless otherwise documented below in the visit note. 

## 2014-03-08 NOTE — Addendum Note (Signed)
Addended by: Viviana Simpler I on: 03/08/2014 11:16 AM   Modules accepted: Orders

## 2014-03-08 NOTE — Assessment & Plan Note (Signed)
Hopefully not diabetic Discussed that she needs to lose weight and keep it off

## 2014-03-08 NOTE — Patient Instructions (Signed)
Please set up your screening mammogram.  DASH Eating Plan DASH stands for "Dietary Approaches to Stop Hypertension." The DASH eating plan is a healthy eating plan that has been shown to reduce high blood pressure (hypertension). Additional health benefits may include reducing the risk of type 2 diabetes mellitus, heart disease, and stroke. The DASH eating plan may also help with weight loss. WHAT DO I NEED TO KNOW ABOUT THE DASH EATING PLAN? For the DASH eating plan, you will follow these general guidelines:  Choose foods with a percent daily value for sodium of less than 5% (as listed on the food label).  Use salt-free seasonings or herbs instead of table salt or sea salt.  Check with your health care provider or pharmacist before using salt substitutes.  Eat lower-sodium products, often labeled as "lower sodium" or "no salt added."  Eat fresh foods.  Eat more vegetables, fruits, and low-fat dairy products.  Choose whole grains. Look for the word "whole" as the first word in the ingredient list.  Choose fish and skinless chicken or [place] more often than red meat. Limit fish, poultry, and meat to 6 oz (170 g) each day.  Limit sweets, desserts, sugars, and sugary drinks.  Choose heart-healthy fats.  Limit cheese to 1 oz (28 g) per day.  Eat more home-cooked food and less restaurant, buffet, and fast food.  Limit fried foods.  Cook foods using methods other than frying.  Limit canned vegetables. If you do use them, rinse them well to decrease the sodium.  When eating at a restaurant, ask that your food be prepared with less salt, or no salt if possible. WHAT FOODS CAN I EAT? Seek help from a dietitian for individual calorie needs. Grains Whole grain or whole wheat bread. Brown rice. Whole grain or whole wheat pasta. Quinoa, bulgur, and whole grain cereals. Low-sodium cereals. Corn or whole wheat flour tortillas. Whole grain cornbread. Whole grain crackers. Low-sodium  crackers. Vegetables Fresh or frozen vegetables (raw, steamed, roasted, or grilled). Low-sodium or reduced-sodium tomato and vegetable juices. Low-sodium or reduced-sodium tomato sauce and paste. Low-sodium or reduced-sodium canned vegetables.  Fruits All fresh, canned (in natural juice), or frozen fruits. Meat and Other Protein Products Ground beef (85% or leaner), grass-fed beef, or beef trimmed of fat. Skinless chicken or [place]. Ground chicken or [place]. Pork trimmed of fat. All fish and seafood. Eggs. Dried beans, peas, or lentils. Unsalted nuts and seeds. Unsalted canned beans. Dairy Low-fat dairy products, such as skim or 1% milk, 2% or reduced-fat cheeses, low-fat ricotta or cottage cheese, or plain low-fat yogurt. Low-sodium or reduced-sodium cheeses. Fats and Oils Tub margarines without trans fats. Light or reduced-fat mayonnaise and salad dressings (reduced sodium). Avocado. Safflower, olive, or canola oils. Natural peanut or almond butter. Other Unsalted popcorn and pretzels. The items listed above may not be a complete list of recommended foods or beverages. Contact your dietitian for more options. WHAT FOODS ARE NOT RECOMMENDED? Grains White bread. White pasta. White rice. Refined cornbread. Bagels and croissants. Crackers that contain trans fat. Vegetables Creamed or fried vegetables. Vegetables in a cheese sauce. Regular canned vegetables. Regular canned tomato sauce and paste. Regular tomato and vegetable juices. Fruits Dried fruits. Canned fruit in light or heavy syrup. Fruit juice. Meat and Other Protein Products Fatty cuts of meat. Ribs, chicken wings, bacon, sausage, bologna, salami, chitterlings, fatback, hot dogs, bratwurst, and packaged luncheon meats. Salted nuts and seeds. Canned beans with salt. Dairy Whole or 2% milk, cream, half-and-half, and  cream cheese. Whole-fat or sweetened yogurt. Full-fat cheeses or blue cheese. Nondairy creamers and whipped toppings.  Processed cheese, cheese spreads, or cheese curds. Condiments Onion and garlic salt, seasoned salt, table salt, and sea salt. Canned and packaged gravies. Worcestershire sauce. Tartar sauce. Barbecue sauce. Teriyaki sauce. Soy sauce, including reduced sodium. Steak sauce. Fish sauce. Oyster sauce. Cocktail sauce. Horseradish. Ketchup and mustard. Meat flavorings and tenderizers. Bouillon cubes. Hot sauce. Tabasco sauce. Marinades. Taco seasonings. Relishes. Fats and Oils Butter, stick margarine, lard, shortening, ghee, and bacon fat. Coconut, palm kernel, or palm oils. Regular salad dressings. Other Pickles and olives. Salted popcorn and pretzels. The items listed above may not be a complete list of foods and beverages to avoid. Contact your dietitian for more information. WHERE CAN I FIND MORE INFORMATION? National Heart, Lung, and Blood Institute: travelstabloid.com Document Released: 03/04/2011 Document Revised: 07/30/2013 Document Reviewed: 01/17/2013 [facility] Patient Information 2015 [place], [place]. This information is not intended to replace advice given to you by your health care provider. Make sure you discuss any questions you have with your health care provider. Exercise to Lose Weight Exercise and a healthy diet may help you lose weight. Your doctor may suggest specific exercises. EXERCISE IDEAS AND TIPS  Choose low-cost things you enjoy doing, such as walking, bicycling, or exercising to workout videos.  Take stairs instead of the elevator.  Walk during your lunch break.  Park your car further away from work or school.  Go to a gym or an exercise class.  Start with 5 to 10 minutes of exercise each day. Build up to 30 minutes of exercise 4 to 6 days a week.  Wear shoes with good support and comfortable clothes.  Stretch before and after working out.  Work out until you breathe harder and your heart beats faster.  Drink extra water when  you exercise.  Do not do so much that you hurt yourself, feel dizzy, or get very short of breath. Exercises that burn about 150 calories:  Running 1  miles in 15 minutes.  Playing volleyball for 45 to 60 minutes.  Washing and waxing a car for 45 to 60 minutes.  Playing touch football for 45 minutes.  Walking 1  miles in 35 minutes.  Pushing a stroller 1  miles in 30 minutes.  Playing basketball for 30 minutes.  Raking leaves for 30 minutes.  Bicycling 5 miles in 30 minutes.  Walking 2 miles in 30 minutes.  Dancing for 30 minutes.  Shoveling snow for 15 minutes.  Swimming laps for 20 minutes.  Walking up stairs for 15 minutes.  Bicycling 4 miles in 15 minutes.  Gardening for 30 to 45 minutes.  Jumping rope for 15 minutes.  Washing windows or floors for 45 to 60 minutes. Document Released: 04/17/2010 Document Revised: 06/07/2011 Document Reviewed: 04/17/2010 [facility] Patient Information 2015 [place], [place]. This information is not intended to replace advice given to you by your health care provider. Make sure you discuss any questions you have with your health care provider.

## 2014-03-11 LAB — CYTOLOGY - PAP

## 2014-03-18 ENCOUNTER — Ambulatory Visit: Payer: Self-pay | Admitting: Internal Medicine

## 2014-03-19 ENCOUNTER — Encounter: Payer: Self-pay | Admitting: Internal Medicine

## 2014-03-19 ENCOUNTER — Ambulatory Visit (INDEPENDENT_AMBULATORY_CARE_PROVIDER_SITE_OTHER): Payer: 59 | Admitting: Internal Medicine

## 2014-03-19 VITALS — BP 128/70 | HR 67 | Temp 97.9°F | Wt 193.0 lb

## 2014-03-19 DIAGNOSIS — E119 Type 2 diabetes mellitus without complications: Secondary | ICD-10-CM

## 2014-03-19 MED ORDER — METFORMIN HCL ER 500 MG PO TB24: 1000.0000 mg | ORAL_TABLET | Freq: Every day | ORAL | Status: DC

## 2014-03-19 NOTE — Assessment & Plan Note (Addendum)
New diagnosis Discussed the importance of proper eating and exercise for consistent ongoing weight loss Will set up diabetic counseling  Start metformin Counseled all of 15 minute visit  Probably need to consider statin though her cholesterol is not bad Will need urine microal next time---BP still good

## 2014-03-19 NOTE — Progress Notes (Signed)
Pre visit review using our clinic review tool, if applicable. No additional management support is needed unless otherwise documented below in the visit note. 

## 2014-03-19 NOTE — Patient Instructions (Signed)
Please start the metformin with 1 tab with breakfast for 3-5 days. If you have no troubles with this (like stomach trouble), go up to 2 tablets daily.

## 2014-03-19 NOTE — Progress Notes (Signed)
   Subjective:    Patient ID: Isabel Johnson, female    DOB: Jun 17, 1950, 63 y.o.   MRN: [id number]  HPI Here for discussion about diagnosis of diabetes  Was surprised since she had been checking her sugars intermittently Usually not over 134 But hadn't checked in 6 months or so No distinct symptoms  Has tried to moderate her eating some Not much for exercise Weight has been stable  Current Outpatient Prescriptions on File Prior to Visit  Medication Sig Dispense Refill  . omeprazole (PRILOSEC) 20 MG capsule Take 1 capsule (20 mg total) by mouth daily. 30 capsule 11   No current facility-administered medications on file prior to visit.    No Known Allergies  Past Medical History  Diagnosis Date  . Endometriosis   . Impaired fasting glucose   . Hyperlipidemia     Past Surgical History  Procedure Laterality Date  . Vaginal delivery      x2  . Laparoscopy  1980's    Family History  Problem Relation Age of Onset  . Diabetes Father   . Hypertension Father   . Stroke Maternal Grandmother   . Heart disease Maternal Grandfather   . Colon cancer Sister   . Cancer Sister     colon and breast cancer    History   Social History  . Marital Status: Married    Spouse Name: N/A    Number of Children: 2  . 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 Smoking status: Never Smoker   . Smokeless tobacco: Never Used  . Alcohol Use: Yes  . Drug Use: No  . Sexual Activity: Not on file   Other Topics Concern  . Not on file   Social History Narrative      Review of Systems     Objective:   Physical Exam        Assessment & Plan:

## 2014-04-02 ENCOUNTER — Ambulatory Visit: Payer: Self-pay | Admitting: Internal Medicine

## 2014-04-29 ENCOUNTER — Ambulatory Visit: Payer: Self-pay | Admitting: Internal Medicine

## 2014-05-28 ENCOUNTER — Ambulatory Visit
Admit: 2014-05-28 | Disposition: A | Discharge status: Home / Self Care | Payer: Self-pay | Attending: Internal Medicine | Admitting: Internal Medicine

## 2014-06-18 ENCOUNTER — Ambulatory Visit: Payer: Self-pay | Admitting: Internal Medicine

## 2014-06-19 ENCOUNTER — Encounter: Payer: Self-pay | Admitting: Internal Medicine

## 2014-06-19 ENCOUNTER — Ambulatory Visit (INDEPENDENT_AMBULATORY_CARE_PROVIDER_SITE_OTHER): Payer: 59 | Admitting: Internal Medicine

## 2014-06-19 VITALS — BP 112/68 | HR 81 | Temp 97.4°F | Wt 187.0 lb

## 2014-06-19 DIAGNOSIS — E119 Type 2 diabetes mellitus without complications: Secondary | ICD-10-CM | POA: Diagnosis not present

## 2014-06-19 LAB — MICROALBUMIN / CREATININE URINE RATIO
Creatinine,U: 116.5 mg/dL
MICROALB UR: 3 mg/dL — AB (ref 0.0–1.9)
Microalb Creat Ratio: 2.6 mg/g (ref 0.0–30.0)

## 2014-06-19 LAB — HM DIABETES FOOT EXAM

## 2014-06-19 LAB — HEMOGLOBIN A1C: Hgb A1c MFr Bld: 7.6 % — ABNORMAL HIGH (ref 4.6–6.5)

## 2014-06-19 NOTE — Progress Notes (Signed)
   Subjective:    Patient ID: Isabel Johnson, female    DOB: 1950-07-03, 64 y.o.   MRN: [id number]  HPI Here for follow up of diabetes  Did finish the diabetic counseling Still checking sugars bid Has been dropping consistently Tends to be higher in the morning Adjusting eating habits---counseled about this Has been starting to walk and signing up at gym  No problems with metformin No cramps, abdominal pain or diarrhea  Current Outpatient Prescriptions on File Prior to Visit  Medication Sig Dispense Refill  . metFORMIN (GLUCOPHAGE-XR) 500 MG 24 hr tablet Take 2 tablets (1,000 mg total) by mouth daily with breakfast. 60 tablet 11   No current facility-administered medications on file prior to visit.    No Known Allergies  Past Medical History  Diagnosis Date  . Endometriosis   . Impaired fasting glucose   . Hyperlipidemia   . Diabetes mellitus without complication     Past Surgical History  Procedure Laterality Date  . Vaginal delivery      x2  . Laparoscopy  1980's    Family History  Problem Relation Age of Onset  . Diabetes Father   . Hypertension Father   . Stroke Maternal Grandmother   . Heart disease Maternal Grandfather   . Colon cancer Sister   . Cancer Sister     colon and breast cancer    History   Social History  . Marital Status: Married    Spouse Name: N/A  . Number of Children: 2  . 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 Smoking status: Never Smoker   . Smokeless tobacco: Never Used  . Alcohol Use: Yes  . Drug Use: No  . Sexual Activity: Not on file   Other Topics Concern  . Not on file   Social History Narrative   Review of Systems  Weight is down 6# Sleeping fine No chest pain No SOB     Objective:   Physical Exam  Constitutional: She appears well-developed and well-nourished. No distress.  Neck: Normal range of motion. Neck supple. No  thyromegaly present.  Cardiovascular: Normal rate, regular rhythm, normal heart sounds and intact distal pulses.  Exam reveals no gallop.   No murmur heard. Pulmonary/Chest: Effort normal and breath sounds normal. No respiratory distress. She has no wheezes. She has no rales.  Musculoskeletal: She exhibits no edema or tenderness.  Lymphadenopathy:    She has no cervical adenopathy.  Neurological:  Normal sensation in feet  Skin:  No foot lesions  Psychiatric: She has a normal mood and affect. Her behavior is normal.          Assessment & Plan:

## 2014-06-19 NOTE — Assessment & Plan Note (Signed)
Done with counseling Doing much better Getting back into exercising Will check A1c--if not close to 8%, will increase metformin Check urine (but BP low)

## 2014-06-19 NOTE — Progress Notes (Signed)
Pre visit review using our clinic review tool, if applicable. No additional management support is needed unless otherwise documented below in the visit note. 

## 2014-08-20 ENCOUNTER — Other Ambulatory Visit: Payer: Self-pay | Admitting: *Deleted

## 2014-08-20 MED ORDER — METFORMIN HCL ER 500 MG PO TB24: 1000.0000 mg | ORAL_TABLET | Freq: Every day | ORAL | Status: DC

## 2014-12-20 ENCOUNTER — Ambulatory Visit: Payer: 59 | Admitting: Internal Medicine

## 2015-01-28 LAB — HM DIABETES EYE EXAM

## 2015-02-11 ENCOUNTER — Encounter: Payer: Self-pay | Admitting: Internal Medicine

## 2015-02-11 ENCOUNTER — Ambulatory Visit (INDEPENDENT_AMBULATORY_CARE_PROVIDER_SITE_OTHER): Payer: 59 | Admitting: Internal Medicine

## 2015-02-11 VITALS — BP 120/70 | HR 69 | Temp 97.7°F | Wt 172.0 lb

## 2015-02-11 DIAGNOSIS — N898 Other specified noninflammatory disorders of vagina: Secondary | ICD-10-CM | POA: Diagnosis not present

## 2015-02-11 DIAGNOSIS — Z23 Encounter for immunization: Secondary | ICD-10-CM

## 2015-02-11 DIAGNOSIS — E119 Type 2 diabetes mellitus without complications: Secondary | ICD-10-CM | POA: Diagnosis not present

## 2015-02-11 LAB — MICROALBUMIN / CREATININE URINE RATIO
Creatinine,U: 112.7 mg/dL
MICROALB UR: 2.1 mg/dL — AB (ref 0.0–1.9)
Microalb Creat Ratio: 1.9 mg/g (ref 0.0–30.0)

## 2015-02-11 LAB — COMPREHENSIVE METABOLIC PANEL
ALBUMIN: 4.3 g/dL (ref 3.5–5.2)
ALT: 19 U/L (ref 0–35)
AST: 18 U/L (ref 0–37)
Alkaline Phosphatase: 70 U/L (ref 39–117)
BUN: 16 mg/dL (ref 6–23)
CHLORIDE: 103 meq/L (ref 96–112)
CO2: 29 mEq/L (ref 19–32)
Calcium: 10 mg/dL (ref 8.4–10.5)
Creatinine, Ser: 0.77 mg/dL (ref 0.40–1.20)
GFR: 80.1 mL/min (ref >60.00)
Glucose, Bld: 144 mg/dL — ABNORMAL HIGH (ref 70–99)
POTASSIUM: 4.3 meq/L (ref 3.5–5.1)
Sodium: 141 mEq/L (ref 135–145)
Total Bilirubin: 0.9 mg/dL (ref 0.2–1.2)
Total Protein: 7.4 g/dL (ref 6.0–8.3)

## 2015-02-11 LAB — CBC WITH DIFFERENTIAL/PLATELET
Basophils Absolute: 0 10*3/uL (ref 0.0–0.1)
Basophils Relative: 0.4 % (ref 0.0–3.0)
Eosinophils Absolute: 0.2 10*3/uL (ref 0.0–0.7)
Eosinophils Relative: 1.9 % (ref 0.0–5.0)
HEMATOCRIT: 45 % (ref 36.0–46.0)
Hemoglobin: 14.9 g/dL (ref 12.0–15.0)
LYMPHS PCT: 28 % (ref 12.0–46.0)
Lymphs Abs: 2.5 10*3/uL (ref 0.7–4.0)
MCHC: 33.1 g/dL (ref 30.0–36.0)
MCV: 90.9 fl (ref 78.0–100.0)
MONOS PCT: 5.5 % (ref 3.0–12.0)
Monocytes Absolute: 0.5 10*3/uL (ref 0.1–1.0)
Neutro Abs: 5.8 10*3/uL (ref 1.4–7.7)
Neutrophils Relative %: 64.2 % (ref 43.0–77.0)
PLATELETS: 305 10*3/uL (ref 150.0–400.0)
RBC: 4.95 Mil/uL (ref 3.87–5.11)
RDW: 12.9 % (ref 11.5–15.5)
WBC: 9.1 10*3/uL (ref 4.0–10.5)

## 2015-02-11 LAB — LIPID PANEL
CHOL/HDL RATIO: 4
Cholesterol: 186 mg/dL (ref 0–200)
HDL: 45.1 mg/dL (ref >39.00)
LDL Cholesterol: 119 mg/dL — ABNORMAL HIGH (ref 0–99)
NonHDL: 141.13
Triglycerides: 113 mg/dL (ref 0.0–149.0)
VLDL: 22.6 mg/dL (ref 0.0–40.0)

## 2015-02-11 LAB — HEMOGLOBIN A1C: Hgb A1c MFr Bld: 6.2 % (ref 4.6–6.5)

## 2015-02-11 LAB — T4, FREE: Free T4: 0.79 ng/dL (ref 0.60–1.60)

## 2015-02-11 NOTE — Addendum Note (Signed)
Addended by: Despina Hidden on: 02/11/2015 03:05 PM   Modules accepted: Orders

## 2015-02-11 NOTE — Assessment & Plan Note (Signed)
Brown and blood tinged Highly suggestive of endometrial abnormality Recent pap normal Will check ultrasound Gyn in [place] if needs procedure

## 2015-02-11 NOTE — Assessment & Plan Note (Signed)
Has done great work with her fitness Weight down 15# Will continue the metformin--check A1c and all labs. Not excited about statins

## 2015-02-11 NOTE — Progress Notes (Signed)
Pre visit review using our clinic review tool, if applicable. No additional management support is needed unless otherwise documented below in the visit note. 

## 2015-02-11 NOTE — Progress Notes (Signed)
   Subjective:    Patient ID: Isabel Johnson, female    DOB: 06-29-1950, 64 y.o.   MRN: [id number]  HPI Here for follow up of diabetes.  Did a fitness challenge and lost 15# Just finished this yesterday Exercising regularly Likes the healthy eating  Checks sugars weekly or so 116-135 mostly. Rarely higher. Fasting No hypoglycemic reactions No numbness, sores pain in feet  Has had some brownish, blood tinged vaginal discharge Started about 3-4 months ago No pain  Current Outpatient Prescriptions on File Prior to Visit  Medication Sig Dispense Refill  . metFORMIN (GLUCOPHAGE-XR) 500 MG 24 hr tablet Take 2 tablets (1,000 mg total) by mouth daily with breakfast. 180 tablet 3   No current facility-administered medications on file prior to visit.    No Known Allergies  Past Medical History  Diagnosis Date  . Endometriosis   . Impaired fasting glucose   . Hyperlipidemia   . Diabetes mellitus without complication [facility])     Past Surgical History  Procedure Laterality Date  . Vaginal delivery      x2  . Laparoscopy  1980's    Family History  Problem Relation Age of Onset  . Diabetes Father   . Hypertension Father   . Stroke Maternal Grandmother   . Heart disease Maternal Grandfather   . Colon cancer Sister   . Cancer Sister     colon and breast cancer    Social History   Social History  . Marital Status: Married    Spouse Name: N/A  . Number of Children: 2  . 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 Smoking status: Never Smoker   . Smokeless tobacco: Never Used  . Alcohol Use: Yes  . Drug Use: No  . Sexual Activity: Not on file   Other Topics Concern  . Not on file   Social History Narrative   Review of Systems Sleeping reasonably well No chest pain No SOB    Objective:   Physical Exam  Constitutional: She appears well-developed and well-nourished. No distress.    Neck: Normal range of motion. Neck supple. No thyromegaly present.  Cardiovascular: Normal rate, regular rhythm, normal heart sounds and intact distal pulses.  Exam reveals no gallop.   No murmur heard. Pulmonary/Chest: Effort normal and breath sounds normal. No respiratory distress. She has no wheezes. She has no rales.  Abdominal: Soft. She exhibits no mass. There is no tenderness.  Musculoskeletal: She exhibits no edema or tenderness.  Lymphadenopathy:    She has no cervical adenopathy.  Skin:  No foot lesions  Psychiatric: She has a normal mood and affect. Her behavior is normal.          Assessment & Plan:

## 2015-02-13 ENCOUNTER — Ambulatory Visit
Admission: RE | Admit: 2015-02-13 | Discharge: 2015-02-13 | Disposition: A | Discharge status: Home / Self Care | Payer: 59 | Source: Ambulatory Visit | Attending: Internal Medicine | Admitting: Internal Medicine

## 2015-02-13 DIAGNOSIS — D251 Intramural leiomyoma of uterus: Secondary | ICD-10-CM | POA: Insufficient documentation

## 2015-02-13 DIAGNOSIS — N898 Other specified noninflammatory disorders of vagina: Secondary | ICD-10-CM | POA: Diagnosis present

## 2015-02-13 DIAGNOSIS — N950 Postmenopausal bleeding: Secondary | ICD-10-CM | POA: Diagnosis not present

## 2015-02-14 ENCOUNTER — Other Ambulatory Visit: Payer: Self-pay | Admitting: Internal Medicine

## 2015-02-14 DIAGNOSIS — N898 Other specified noninflammatory disorders of vagina: Secondary | ICD-10-CM

## 2015-03-07 NOTE — H&P (Signed)
  Patient name Isabel Johnson, Isabel Johnson DICTATION# [id number] CSN# [id number]  [facility], MD 03/07/2015 8:05 AM

## 2015-03-08 NOTE — H&P (Signed)
Isabel Johnson, DONE NO.:  [id number]  MEDICAL RECORD NO.:  [id number]  LOCATION:                                 FACILITY:  PHYSICIAN:  Darlyn Chamber, M.D.   DATE OF BIRTH:  01/12/1951  DATE OF ADMISSION: DATE OF DISCHARGE:                             HISTORY & PHYSICAL   HISTORY OF PRESENT ILLNESS:  A 64 year old postmenopausal patient referred into our practice for vaginal bleeding for the past 2-3 months. She went through menopause in 2002.  We did a saline infusion ultrasound in the office that did reveal 2 endometrial polyps.  She now presents for hysteroscopic resection of endometrial polyps.  ALLERGIES:  None.  MEDICATIONS:  Metformin.  PAST MEDICAL HISTORY:  She has a history of diabetes which is being actively managed, history of endometriosis.  Past history of hyperlipidemia.  PAST SURGICAL HISTORY:  She has had 2 vaginal deliveries and 1 laparoscopy with find of endometriosis.  SOCIAL HISTORY:  Reveals no tobacco, alcohol use.  FAMILY HISTORY:  History of diabetes, hypertension, stroke, heart disease, and colon cancer.  REVIEW OF SYSTEMS:  Noncontributory.  PHYSICAL EXAMINATION:  VITAL SIGNS:  The patient is afebrile with stable vital signs.  HEENT:  The patient is normocephalic.  Pupils equal, round, and reactive to light and accommodation.  Extraocular movements were intact.  Sclerae and conjunctiva clear.  Oropharynx clear. BREASTS:  Not examined. LUNGS:  Clear. CARDIOVASCULAR SYSTEM:  Regular rhythm and rate.  There are no murmurs or gallops. ABDOMEN:  Benign.  No mass, organomegaly, or tenderness. PELVIC:  Normal external genitalia.  Vaginal mucosa is clear, though atrophic.  Cervix unremarkable.  Uterus normal size, shape, and contour. Adnexa free of mass or tenderness. EXTREMITIES:  Trace edema. NEUROLOGIC:  Grossly within normal limits.  IMPRESSION:  Postmenopausal bleeding with endometrial polyp.  PLAN:  The patient will  undergo hysteroscopy with D and C.  The nature of procedure have been discussed.  The risks have been explained including the risk of infection.  Risk of vascular injury could lead to hemorrhage requiring transfusion with the risk of AIDS or hepatitis. Excessive bleeding could require hysterectomy.  The risk of perforation of injury to adjacent organs such as bowel that could require diagnostic laparoscopy and possible exploratory surgery, risk of deep venous thrombosis and pulmonary embolus.  The patient expressed understanding of potential risks and complications.     Darlyn Chamber, M.D.     JSM/MEDQ  D:  03/07/2015  T:  03/08/2015  Job:  [id number]

## 2015-03-13 ENCOUNTER — Encounter (HOSPITAL_BASED_OUTPATIENT_CLINIC_OR_DEPARTMENT_OTHER): Payer: Self-pay | Admitting: *Deleted

## 2015-03-13 NOTE — Progress Notes (Signed)
NPO AFTER MN.  ARRIVE AT 0600. NEEDS EKG.  GETTING LAB WORK DONE FRI  03-14-2015 (CBC, BMET).

## 2015-03-14 DIAGNOSIS — E785 Hyperlipidemia, unspecified: Secondary | ICD-10-CM | POA: Diagnosis not present

## 2015-03-14 DIAGNOSIS — E119 Type 2 diabetes mellitus without complications: Secondary | ICD-10-CM | POA: Diagnosis not present

## 2015-03-14 DIAGNOSIS — N840 Polyp of corpus uteri: Secondary | ICD-10-CM | POA: Diagnosis present

## 2015-03-14 DIAGNOSIS — Z7984 Long term (current) use of oral hypoglycemic drugs: Secondary | ICD-10-CM | POA: Diagnosis not present

## 2015-03-14 LAB — CBC
HCT: 45.4 % (ref 36.0–46.0)
HEMOGLOBIN: 14.9 g/dL (ref 12.0–15.0)
MCH: 30.5 pg (ref 26.0–34.0)
MCHC: 32.8 g/dL (ref 30.0–36.0)
MCV: 93 fL (ref 78.0–100.0)
Platelets: 280 10*3/uL (ref 150–400)
RBC: 4.88 MIL/uL (ref 3.87–5.11)
RDW: 12.4 % (ref 11.5–15.5)
WBC: 8.2 10*3/uL (ref 4.0–10.5)

## 2015-03-14 LAB — BASIC METABOLIC PANEL
ANION GAP: 9 (ref 5–15)
BUN: 20 mg/dL (ref 6–20)
CALCIUM: 9.5 mg/dL (ref 8.9–10.3)
CO2: 29 mmol/L (ref 22–32)
CREATININE: 0.7 mg/dL (ref 0.44–1.00)
Chloride: 99 mmol/L — ABNORMAL LOW (ref 101–111)
GFR calc non Af Amer: >60 mL/min (ref >60)
Glucose, Bld: 172 mg/dL — ABNORMAL HIGH (ref 65–99)
Potassium: 4 mmol/L (ref 3.5–5.1)
SODIUM: 137 mmol/L (ref 135–145)

## 2015-03-17 ENCOUNTER — Other Ambulatory Visit: Payer: Self-pay

## 2015-03-17 ENCOUNTER — Ambulatory Visit (HOSPITAL_BASED_OUTPATIENT_CLINIC_OR_DEPARTMENT_OTHER): Payer: 59 | Admitting: Anesthesiology

## 2015-03-17 ENCOUNTER — Encounter (HOSPITAL_BASED_OUTPATIENT_CLINIC_OR_DEPARTMENT_OTHER)
Admission: RE | Disposition: A | Discharge status: Home / Self Care | Payer: Self-pay | Source: Ambulatory Visit | Attending: Obstetrics and Gynecology

## 2015-03-17 ENCOUNTER — Encounter (HOSPITAL_BASED_OUTPATIENT_CLINIC_OR_DEPARTMENT_OTHER): Payer: Self-pay | Admitting: *Deleted

## 2015-03-17 ENCOUNTER — Ambulatory Visit (HOSPITAL_BASED_OUTPATIENT_CLINIC_OR_DEPARTMENT_OTHER)
Admission: RE | Admit: 2015-03-17 | Discharge: 2015-03-17 | Disposition: A | Discharge status: Home / Self Care | Payer: 59 | Source: Ambulatory Visit | Attending: Obstetrics and Gynecology | Admitting: Obstetrics and Gynecology

## 2015-03-17 DIAGNOSIS — E785 Hyperlipidemia, unspecified: Secondary | ICD-10-CM | POA: Diagnosis not present

## 2015-03-17 DIAGNOSIS — N840 Polyp of corpus uteri: Secondary | ICD-10-CM | POA: Diagnosis not present

## 2015-03-17 DIAGNOSIS — N950 Postmenopausal bleeding: Secondary | ICD-10-CM

## 2015-03-17 DIAGNOSIS — Z7984 Long term (current) use of oral hypoglycemic drugs: Secondary | ICD-10-CM | POA: Diagnosis not present

## 2015-03-17 DIAGNOSIS — E119 Type 2 diabetes mellitus without complications: Secondary | ICD-10-CM | POA: Diagnosis not present

## 2015-03-17 HISTORY — DX: Presence of spectacles and contact lenses: Z97.3

## 2015-03-17 HISTORY — DX: Polyp of corpus uteri: N84.0

## 2015-03-17 HISTORY — DX: Nausea with vomiting, unspecified: R11.2

## 2015-03-17 HISTORY — DX: Postmenopausal bleeding: N95.0

## 2015-03-17 HISTORY — DX: Type 2 diabetes mellitus without complications: E11.9

## 2015-03-17 HISTORY — PX: DILATATION & CURETTAGE/HYSTEROSCOPY WITH MYOSURE: SHX6511

## 2015-03-17 HISTORY — DX: Other specified postprocedural states: Z98.890

## 2015-03-17 LAB — GLUCOSE, CAPILLARY
Glucose-Capillary: 158 mg/dL — ABNORMAL HIGH (ref 65–99)
Glucose-Capillary: 159 mg/dL — ABNORMAL HIGH (ref 65–99)

## 2015-03-17 SURGERY — DILATATION & CURETTAGE/HYSTEROSCOPY WITH MYOSURE
Anesthesia: General | Site: Vagina

## 2015-03-17 MED ORDER — FENTANYL CITRATE (PF) 100 MCG/2ML IJ SOLN
25.0000 ug | INTRAMUSCULAR | Status: DC | PRN
  Filled 2015-03-17: qty 1

## 2015-03-17 MED ORDER — LACTATED RINGERS IV SOLN
INTRAVENOUS | Status: DC
  Administered 2015-03-17: 07:00:00 via INTRAVENOUS
  Filled 2015-03-17: qty 1000

## 2015-03-17 MED ORDER — LACTATED RINGERS IV SOLN
INTRAVENOUS | Status: DC
  Filled 2015-03-17: qty 1000

## 2015-03-17 MED ORDER — LIDOCAINE HCL (CARDIAC) 20 MG/ML IV SOLN
INTRAVENOUS | Status: AC
  Filled 2015-03-17: qty 5

## 2015-03-17 MED ORDER — LIDOCAINE-EPINEPHRINE 1 %-1:100000 IJ SOLN
INTRAMUSCULAR | Status: DC | PRN
  Administered 2015-03-17: 10 mL

## 2015-03-17 MED ORDER — ONDANSETRON HCL 4 MG/2ML IJ SOLN
INTRAMUSCULAR | Status: AC
  Filled 2015-03-17: qty 2

## 2015-03-17 MED ORDER — ONDANSETRON HCL 4 MG/2ML IJ SOLN
INTRAMUSCULAR | Status: AC
Start: 2015-03-17 — End: 2015-03-17
  Filled 2015-03-17: qty 2

## 2015-03-17 MED ORDER — KETOROLAC TROMETHAMINE 30 MG/ML IJ SOLN
INTRAMUSCULAR | Status: AC
  Filled 2015-03-17: qty 1

## 2015-03-17 MED ORDER — CEFAZOLIN SODIUM-DEXTROSE 2-3 GM-% IV SOLR
INTRAVENOUS | Status: AC
  Filled 2015-03-17: qty 50

## 2015-03-17 MED ORDER — DEXAMETHASONE SODIUM PHOSPHATE 4 MG/ML IJ SOLN
INTRAMUSCULAR | Status: DC | PRN
  Administered 2015-03-17: 10 mg via INTRAVENOUS

## 2015-03-17 MED ORDER — OXYCODONE-ACETAMINOPHEN 5-325 MG PO TABS
1.0000 | ORAL_TABLET | ORAL | Status: AC | PRN
  Administered 2015-03-17: 1 via ORAL
  Filled 2015-03-17: qty 2

## 2015-03-17 MED ORDER — DEXAMETHASONE SODIUM PHOSPHATE 10 MG/ML IJ SOLN
INTRAMUSCULAR | Status: AC
  Filled 2015-03-17: qty 1

## 2015-03-17 MED ORDER — KETOROLAC TROMETHAMINE 30 MG/ML IJ SOLN
INTRAMUSCULAR | Status: DC | PRN
  Administered 2015-03-17: 30 mg via INTRAVENOUS

## 2015-03-17 MED ORDER — FENTANYL CITRATE (PF) 100 MCG/2ML IJ SOLN
INTRAMUSCULAR | Status: DC | PRN
  Administered 2015-03-17: 100 ug via INTRAVENOUS

## 2015-03-17 MED ORDER — ONDANSETRON HCL 4 MG/2ML IJ SOLN
INTRAMUSCULAR | Status: DC | PRN
  Administered 2015-03-17: 4 mg via INTRAVENOUS

## 2015-03-17 MED ORDER — OXYCODONE-ACETAMINOPHEN 5-325 MG PO TABS
ORAL_TABLET | ORAL | Status: AC
  Filled 2015-03-17: qty 1

## 2015-03-17 MED ORDER — LIDOCAINE HCL (CARDIAC) 20 MG/ML IV SOLN
INTRAVENOUS | Status: DC | PRN
  Administered 2015-03-17: 70 mg via INTRAVENOUS

## 2015-03-17 MED ORDER — CEFAZOLIN SODIUM-DEXTROSE 2-3 GM-% IV SOLR
2.0000 g | INTRAVENOUS | Status: AC
  Administered 2015-03-17: 2 g via INTRAVENOUS
  Filled 2015-03-17: qty 50

## 2015-03-17 MED ORDER — SODIUM CHLORIDE 0.9 % IR SOLN
Status: DC | PRN
  Administered 2015-03-17: 3000 mL

## 2015-03-17 MED ORDER — FENTANYL CITRATE (PF) 100 MCG/2ML IJ SOLN
INTRAMUSCULAR | Status: AC
  Filled 2015-03-17: qty 4

## 2015-03-17 MED ORDER — PROPOFOL 10 MG/ML IV BOLUS
INTRAVENOUS | Status: DC | PRN
  Administered 2015-03-17: 40 mg via INTRAVENOUS
  Administered 2015-03-17: 160 mg via INTRAVENOUS

## 2015-03-17 MED ORDER — MIDAZOLAM HCL 2 MG/2ML IJ SOLN
INTRAMUSCULAR | Status: AC
  Filled 2015-03-17: qty 2

## 2015-03-17 MED ORDER — OXYCODONE-ACETAMINOPHEN 7.5-325 MG PO TABS: 1.0000 | ORAL_TABLET | ORAL | Status: DC | PRN

## 2015-03-17 MED ORDER — PROPOFOL 10 MG/ML IV BOLUS
INTRAVENOUS | Status: AC
  Filled 2015-03-17: qty 40

## 2015-03-17 SURGICAL SUPPLY — 40 items
CANISTER SUCTION 2500CC (MISCELLANEOUS) ×2 IMPLANT
CATH ROBINSON RED A/P 16FR (CATHETERS) IMPLANT
CLOTH BEACON ORANGE TIMEOUT ST (SAFETY) ×2 IMPLANT
CORD ACTIVE DISPOSABLE (ELECTRODE)
CORD ELECTRO ACTIVE DISP (ELECTRODE) IMPLANT
COVER BACK TABLE 60X90IN (DRAPES) ×2 IMPLANT
DEVICE MYOSURE CLASSIC (MISCELLANEOUS) IMPLANT
DEVICE MYOSURE LITE (MISCELLANEOUS) ×1 IMPLANT
DRAPE LG THREE QUARTER DISP (DRAPES) ×2 IMPLANT
DRSG TELFA 3X8 NADH (GAUZE/BANDAGES/DRESSINGS) ×2 IMPLANT
ELECT LOOP GYNE PRO 24FR (CUTTING LOOP)
ELECT REM PT RETURN 9FT ADLT (ELECTROSURGICAL) ×2
ELECT VAPORTRODE GRVD BAR (ELECTRODE) IMPLANT
ELECTRODE LOOP GYNE PRO 24FR (CUTTING LOOP) IMPLANT
ELECTRODE REM PT RTRN 9FT ADLT (ELECTROSURGICAL) ×1 IMPLANT
ELECTRODE VAPORCUT 22FR (ELECTROSURGICAL) IMPLANT
GLOVE BIO SURGEON STRL SZ 6.5 (GLOVE) ×1 IMPLANT
GLOVE BIO SURGEON STRL SZ7 (GLOVE) ×4 IMPLANT
GLOVE BIOGEL PI IND STRL 6.5 (GLOVE) IMPLANT
GLOVE BIOGEL PI IND STRL 7.5 (GLOVE) IMPLANT
GLOVE BIOGEL PI INDICATOR 6.5 (GLOVE) ×1
GLOVE BIOGEL PI INDICATOR 7.5 (GLOVE) ×1
GOWN STRL REUS W/ TWL LRG LVL3 (GOWN DISPOSABLE) ×2 IMPLANT
GOWN STRL REUS W/TWL LRG LVL3 (GOWN DISPOSABLE) ×4
KIT ROOM TURNOVER WOR (KITS) ×2 IMPLANT
LEGGING LITHOTOMY PAIR STRL (DRAPES) ×2 IMPLANT
MANIFOLD NEPTUNE II (INSTRUMENTS) IMPLANT
NDL SPNL 18GX3.5 QUINCKE PK (NEEDLE) ×1 IMPLANT
NEEDLE SPNL 18GX3.5 QUINCKE PK (NEEDLE) ×2 IMPLANT
PACK BASIN DAY SURGERY FS (CUSTOM PROCEDURE TRAY) ×2 IMPLANT
PAD DRESSING TELFA 3X8 NADH (GAUZE/BANDAGES/DRESSINGS) ×1 IMPLANT
PAD OB MATERNITY 4.3X12.25 (PERSONAL CARE ITEMS) ×2 IMPLANT
PAD PREP 24X48 CUFFED NSTRL (MISCELLANEOUS) ×2 IMPLANT
SEAL ROD LENS SCOPE MYOSURE (ABLATOR) ×2 IMPLANT
SYR CONTROL 10ML LL (SYRINGE) ×2 IMPLANT
TOWEL OR 17X24 6PK STRL BLUE (TOWEL DISPOSABLE) ×4 IMPLANT
TUBE CONNECTING 12X1/4 (SUCTIONS) IMPLANT
TUBING AQUILEX INFLOW (TUBING) ×1 IMPLANT
TUBING AQUILEX OUTFLOW (TUBING) ×1 IMPLANT
WATER STERILE IRR 500ML POUR (IV SOLUTION) ×2 IMPLANT

## 2015-03-17 NOTE — H&P (Signed)
  History and physical exam unchanged 

## 2015-03-17 NOTE — Anesthesia Preprocedure Evaluation (Addendum)
Anesthesia Evaluation  Patient identified by MRN, date of birth, ID band Patient awake    Reviewed: Allergy & Precautions, H&P , NPO status , Patient's Chart, lab work & pertinent test results  History of Anesthesia Complications (+) PONV  Airway Mallampati: II  TM Distance: >3 FB Neck ROM: full    Dental no notable dental hx. (+) Dental Advisory Given, Teeth Intact, Caps,    Pulmonary neg pulmonary ROS,    Pulmonary exam normal breath sounds clear to auscultation       Cardiovascular Exercise Tolerance: Good negative cardio ROS Normal cardiovascular exam Rhythm:regular Rate:Normal     Neuro/Psych negative neurological ROS  negative psych ROS   GI/Hepatic negative GI ROS, Neg liver ROS,   Endo/Other  diabetes, Well Controlled, Type 2, Oral Hypoglycemic Agents  Renal/GU negative Renal ROS  negative genitourinary   Musculoskeletal   Abdominal   Peds  Hematology negative hematology ROS (+)   Anesthesia Other Findings   Reproductive/Obstetrics negative OB ROS                           Anesthesia Physical Anesthesia Plan  ASA: II  Anesthesia Plan: General   Post-op Pain Management:    Induction: Intravenous  Airway Management Planned: LMA  Additional Equipment:   Intra-op Plan:   Post-operative Plan:   Informed Consent: I have reviewed the patients History and Physical, chart, labs and discussed the procedure including the risks, benefits and alternatives for the proposed anesthesia with the patient or authorized representative who has indicated his/her understanding and acceptance.   Dental Advisory Given  Plan Discussed with: CRNA and Surgeon  Anesthesia Plan Comments:         Anesthesia Quick Evaluation

## 2015-03-17 NOTE — Discharge Instructions (Signed)

## 2015-03-17 NOTE — Anesthesia Procedure Notes (Signed)
Procedure Name: LMA Insertion Date/Time: 03/17/2015 7:38 AM Performed by: Wanita Chamberlain Pre-anesthesia Checklist: Patient identified, Timeout performed, Emergency Drugs available, Suction available and Patient being monitored Patient Re-evaluated:Patient Re-evaluated prior to inductionOxygen Delivery Method: Circle system utilized Preoxygenation: Pre-oxygenation with 100% oxygen Intubation Type: IV induction Ventilation: Mask ventilation without difficulty LMA: LMA inserted LMA Size: 4.0 Number of attempts: 1 Placement Confirmation: positive ETCO2 and breath sounds checked- equal and bilateral Tube secured with: Tape Dental Injury: Teeth and Oropharynx as per pre-operative assessment

## 2015-03-17 NOTE — Anesthesia Postprocedure Evaluation (Signed)
Anesthesia Post Note  Patient: SCHUYLAR BEHANNA  Procedure(s) Performed: Procedure(s) (LRB): DILATATION & CURETTAGE/HYSTEROSCOPY WITH MYOSURE (N/A)  Patient location during evaluation: PACU Anesthesia Type: General Level of consciousness: awake and alert Pain management: pain level controlled Vital Signs Assessment: post-procedure vital signs reviewed and stable Respiratory status: spontaneous breathing, nonlabored ventilation, respiratory function stable and patient connected to nasal cannula oxygen Cardiovascular status: blood pressure returned to baseline and stable Postop Assessment: no signs of nausea or vomiting Anesthetic complications: no    Last Vitals:  Filed Vitals:   03/17/15 0830 03/17/15 0840  BP: 118/71 117/59  Pulse: 63 56  Temp:    Resp: 16 15    Last Pain:  Filed Vitals:   03/17/15 0907  PainSc: 6                  Naome Brigandi L

## 2015-03-17 NOTE — Brief Op Note (Signed)
03/17/2015  7:56 AM  PATIENT:  Isabel Johnson  64 y.o. female  PRE-OPERATIVE DIAGNOSIS:  polyp  POST-OPERATIVE DIAGNOSIS:  polyp  PROCEDURE:  Procedure(s): DILATATION & CURETTAGE/HYSTEROSCOPY WITH MYOSURE (N/A)  SURGEON:  Surgeon(s) and Role:    * Arvella Nigh, MD - Primary  PHYSICIAN ASSISTANT:   ASSISTANTS: none   ANESTHESIA:   general and paracervical block  EBL:     BLOOD ADMINISTERED:none  DRAINS: none   LOCAL MEDICATIONS USED:  XYLOCAINE   SPECIMEN:  Source of Specimen:  uterine polyps and currettings  DISPOSITION OF SPECIMEN:  PATHOLOGY  COUNTS:  YES  TOURNIQUET:  * No tourniquets in log *  DICTATION: .Other Dictation: Dictation Number [id number]  PLAN OF CARE: Discharge to home after PACU  PATIENT DISPOSITION:  PACU - hemodynamically stable.   Delay start of Pharmacological VTE agent (>24hrs) due to surgical blood loss or risk of bleeding: not applicable

## 2015-03-17 NOTE — Transfer of Care (Signed)
Immediate Anesthesia Transfer of Care Note  Patient: Isabel Johnson  Procedure(s) Performed: Procedure(s): DILATATION & CURETTAGE/HYSTEROSCOPY WITH MYOSURE (N/A)  Patient Location: PACU  Anesthesia Type:General  Level of Consciousness: awake, alert , oriented and patient cooperative  Airway & Oxygen Therapy: Patient Spontanous Breathing and Patient connected to nasal cannula oxygen  Post-op Assessment: Report given to RN and Post -op Vital signs reviewed and stable  Post vital signs: Reviewed and stable  Last Vitals:  Filed Vitals:   03/17/15 0627  BP: 132/57  Pulse: 65  Temp: 36.4 C  Resp: 14    Complications: No apparent anesthesia complications

## 2015-03-18 ENCOUNTER — Encounter: Payer: 59 | Admitting: Internal Medicine

## 2015-03-18 ENCOUNTER — Encounter (HOSPITAL_BASED_OUTPATIENT_CLINIC_OR_DEPARTMENT_OTHER): Payer: Self-pay | Admitting: Obstetrics and Gynecology

## 2015-03-19 NOTE — Op Note (Signed)
Isabel Johnson, Isabel Johnson                ACCOUNT NO.:  [id number]  MEDICAL RECORD NO.:  [id number]  LOCATION:                               FACILITY:  [facility]  PHYSICIAN:  Darlyn Chamber, M.D.   DATE OF BIRTH:  October 25, 1950  DATE OF PROCEDURE:  03/17/2015 DATE OF DISCHARGE:  03/17/2015                              OPERATIVE REPORT   PREOPERATIVE DIAGNOSIS:  Postmenopausal bleeding with endometrial polyps.  POSTOPERATIVE DIAGNOSIS:  Postmenopausal bleeding with endometrial polyps.  OPERATIVE PROCEDURE:  Paracervical block, cervical dilation, hysteroscopy with MyoSure resection of endometrial polyps.  Endometrial curettings.  SURGEON:  Darlyn Chamber, MD  ANESTHESIA:  General paracervical block.  BLOOD LOSS:  Minimal.  PACKS AND DRAINS:  None.  INTRAOPERATIVE BLOOD PLACED:  None.  COMPLICATIONS:  None.  INDICATION:  Are previous, see dictated course.  The patient was taken to OR, placed in supine position.  After satisfactory level of general anesthesia obtained, the patient was placed in the dorsal lithotomy position with the Allen stirrups.  Perineum and vagina were prepped out with Betadine and draped as sterile field.  A speculum was placed in the vaginal vault.  Anterior lip of the cervix was secured with a single- tooth tenaculum.  Paracervical block 1% Xylocaine with epinephrine was then instituted.  Uterus sounded to approximately 8 cm.  Cervix serially dilated to a size 25 Pratt dilator.  Hysteroscope was introduced. Intrauterine cavity was distended using saline.  She had some thickened myometrium anterior that could be polyp like and posteriorly.  Both these areas were completely resected with the MyoSure.  At the end of procedure, both areas were completely resected.  No evidence of perforation or excessive fluid loss.  Hysteroscope along with MyoSure were then removed.  Endometrial curettings were obtained.  Single-tooth tenaculum and speculum were then  removed. Hemostasis was excellent.  The patient tolerated the procedure well, once alert and extubated, transferred to recovery room in good condition.  Sponges, instrument, and needle count was correct by circulating nurse.     Darlyn Chamber, M.D.     JSM/MEDQ  D:  03/17/2015  T:  03/17/2015  Job:  [id number]

## 2015-08-01 ENCOUNTER — Encounter: Payer: 59 | Admitting: Internal Medicine

## 2015-08-13 ENCOUNTER — Encounter: Payer: Self-pay | Admitting: Internal Medicine

## 2015-08-13 ENCOUNTER — Ambulatory Visit (INDEPENDENT_AMBULATORY_CARE_PROVIDER_SITE_OTHER): Payer: No Typology Code available for payment source | Admitting: Internal Medicine

## 2015-08-13 VITALS — BP 130/82 | HR 80 | Temp 98.0°F | Ht 67.0 in | Wt 189.0 lb

## 2015-08-13 DIAGNOSIS — Z0000 Encounter for general adult medical examination without abnormal findings: Secondary | ICD-10-CM

## 2015-08-13 DIAGNOSIS — E119 Type 2 diabetes mellitus without complications: Secondary | ICD-10-CM

## 2015-08-13 LAB — HEMOGLOBIN A1C: Hgb A1c MFr Bld: 7.3 % — ABNORMAL HIGH (ref 4.6–6.5)

## 2015-08-13 LAB — HM DIABETES FOOT EXAM

## 2015-08-13 MED ORDER — ZOSTER VACCINE LIVE 19400 UNT/0.65ML ~~LOC~~ SUSR: 0.6500 mL | Freq: Once | SUBCUTANEOUS | Status: DC

## 2015-08-13 NOTE — Progress Notes (Signed)
Pre visit review using our clinic review tool, if applicable. No additional management support is needed unless otherwise documented below in the visit note. 

## 2015-08-13 NOTE — Assessment & Plan Note (Signed)
Seems to be fine Will recheck A1c

## 2015-08-13 NOTE — Progress Notes (Signed)
Subjective:    Patient ID: Isabel Johnson, female    DOB: 05/20/1950, 65 y.o.   MRN: [id number]  HPI Here for physical  Has gained some weight back Sugars still doing fine No problems with metformin--other than some bouts with diarrhea No sores, numbness or pain in feet Had negative eye exam-- ~November last year Not much exercise  No vaginal bleeding since D&C Had endometrial polyp  Current Outpatient Prescriptions on File Prior to Visit  Medication Sig Dispense Refill  . metFORMIN (GLUCOPHAGE) 1000 MG tablet Take 1,000 mg by mouth every evening.     No current facility-administered medications on file prior to visit.    No Known Allergies  Past Medical History  Diagnosis Date  . Endometriosis   . Hyperlipidemia   . Type 2 diabetes mellitus ([place])   . Endometrial polyp   . PMB (postmenopausal bleeding)   . PONV (postoperative nausea and vomiting)   . Wears contact lenses     Past Surgical History  Procedure Laterality Date  . Laparoscopy  1980    endometriosis  . Dilatation & curettage/hysteroscopy with myosure N/A 03/17/2015    Procedure: DILATATION & CURETTAGE/HYSTEROSCOPY WITH MYOSURE;  Surgeon: Arvella Nigh, MD;  Location: [place];  Service: Gynecology;  Laterality: N/A;    Family History  Problem Relation Age of Onset  . Diabetes Father   . Hypertension Father   . Stroke Maternal Grandmother   . Heart disease Maternal Grandfather   . Colon cancer Sister   . Cancer Sister     colon and breast cancer    Social History   Social History  . Marital Status: Married    Spouse Name: N/A  . Number of Children: 2  . 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 Smoking status: Never Smoker   . Smokeless tobacco: Never Used  . Alcohol Use: Yes  . Drug Use: No  . Sexual Activity: Not on file   Other Topics Concern  . Not on file   Social History Narrative     Review of Systems  Constitutional: Negative for fatigue.       Wears seat belt  HENT: Negative for dental problem, hearing loss, tinnitus and trouble swallowing.        Keeps up with dentist  Eyes: Negative for visual disturbance.       No diplopia or unilateral vision loss  Respiratory: Negative for cough, chest tightness and shortness of breath.   Cardiovascular: Negative for chest pain, palpitations and leg swelling.  Gastrointestinal: Positive for diarrhea. Negative for nausea, vomiting, abdominal pain and blood in stool.       No heartburn   Endocrine: Negative for polydipsia and polyuria.  Genitourinary: Negative for dysuria, hematuria, difficulty urinating and dyspareunia.  Musculoskeletal: Negative for back pain, joint swelling and arthralgias.  Skin:       Rosacea--no Rx at present  Allergic/Immunologic: Negative for environmental allergies and immunocompromised state.  Neurological: Negative for dizziness, syncope, weakness, light-headedness and headaches.  Hematological: Negative for adenopathy. Does not bruise/bleed easily.  Psychiatric/Behavioral: Negative for sleep disturbance and dysphoric mood. The patient is not nervous/anxious.        Objective:   Physical Exam  Constitutional: She is oriented to person, place, and time. She appears well-developed and well-nourished. No distress.  HENT:  Head: Normocephalic and atraumatic.  Right Ear: External ear normal.  Left Ear: External ear normal.  Mouth/Throat: Oropharynx is clear and moist. No oropharyngeal exudate.  Eyes: Conjunctivae are normal. Pupils are equal, round, and reactive to light.  Neck: Normal range of motion. Neck supple. No thyromegaly present.  Cardiovascular: Normal rate, regular rhythm, normal heart sounds and intact distal pulses.  Exam reveals no gallop.   No murmur heard. Pulmonary/Chest: Effort normal and breath sounds normal. No respiratory distress. She has no wheezes. She has no rales.   Abdominal: Soft. There is no tenderness.  Musculoskeletal: She exhibits no edema or tenderness.  Lymphadenopathy:    She has no cervical adenopathy.  Neurological: She is alert and oriented to person, place, and time.  Normal sensation in feet  Skin: No rash noted. No erythema.  No foot lesions  Psychiatric: She has a normal mood and affect. Her behavior is normal.          Assessment & Plan:

## 2015-08-13 NOTE — Assessment & Plan Note (Signed)
Doing well Discussed fitness and weight loss Colon due 10/17 mammo due 12/17 Rx for zostavax

## 2015-08-22 ENCOUNTER — Other Ambulatory Visit: Payer: Self-pay | Admitting: Internal Medicine

## 2015-09-02 ENCOUNTER — Other Ambulatory Visit: Payer: Self-pay

## 2015-09-02 MED ORDER — METFORMIN HCL 1000 MG PO TABS: 1000.0000 mg | ORAL_TABLET | Freq: Every evening | ORAL | Status: DC

## 2015-09-02 NOTE — Telephone Encounter (Signed)
Pt needed a 90 day supply of metformin sent to [facility] and a 30 day supply sent to [facility]. I sent the rxs to each

## 2015-10-08 LAB — HM DIABETES EYE EXAM

## 2015-10-23 ENCOUNTER — Encounter: Payer: Self-pay | Admitting: Internal Medicine

## 2015-12-12 IMAGING — MG MM DIGITAL SCREENING BILAT W/ CAD
4 series · 4 of 4 positions shown · non-contrast
Comparison: Previous exam(s).

CLINICAL DATA: Screening.

EXAM:
DIGITAL SCREENING BILATERAL MAMMOGRAM WITH CAD

[R MLO]
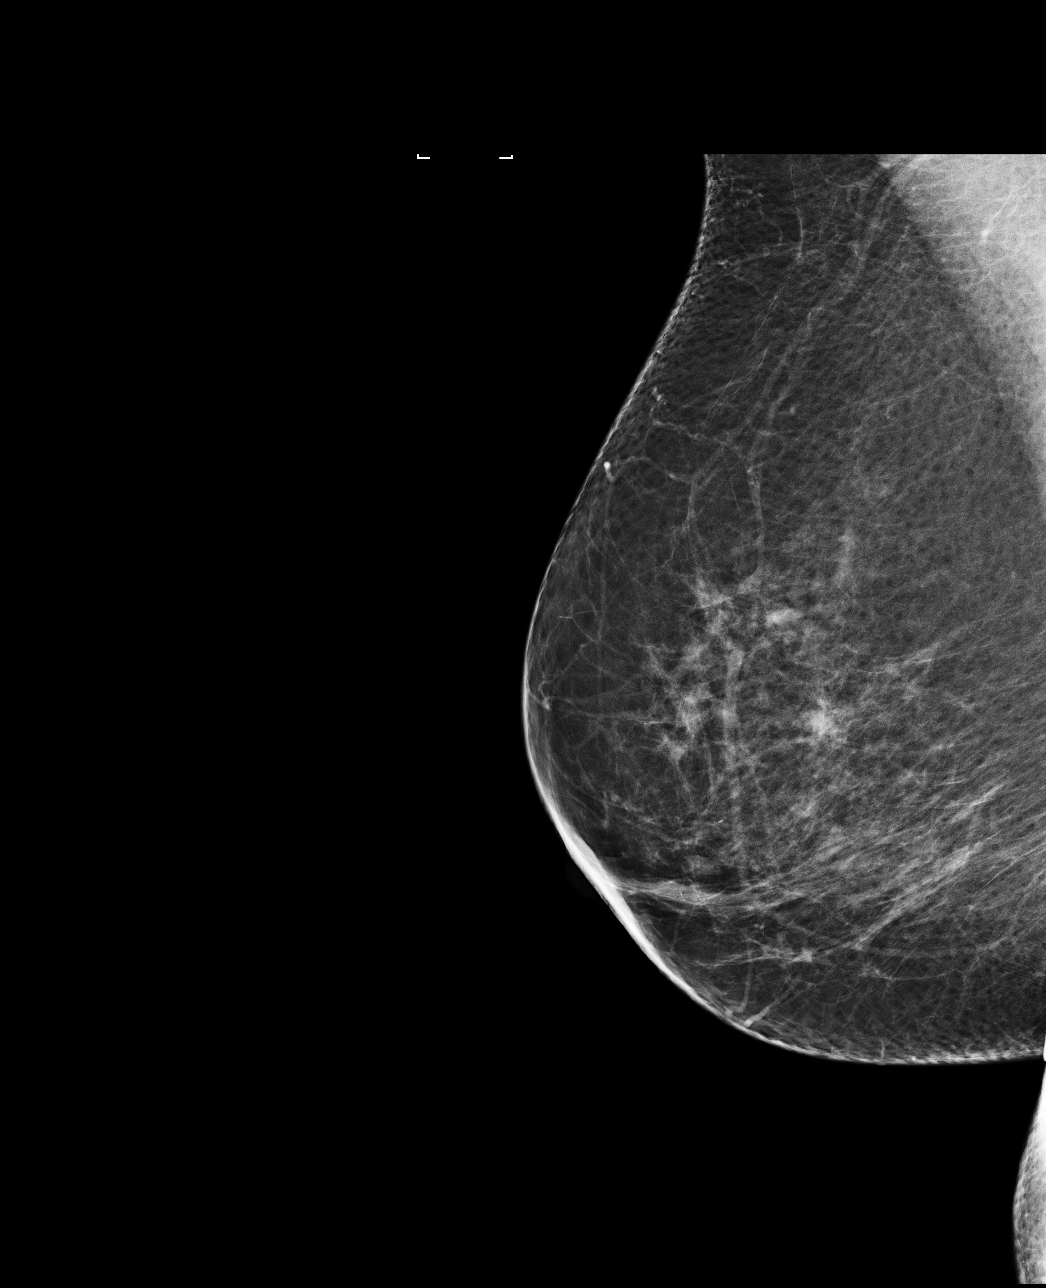

[L MLO]
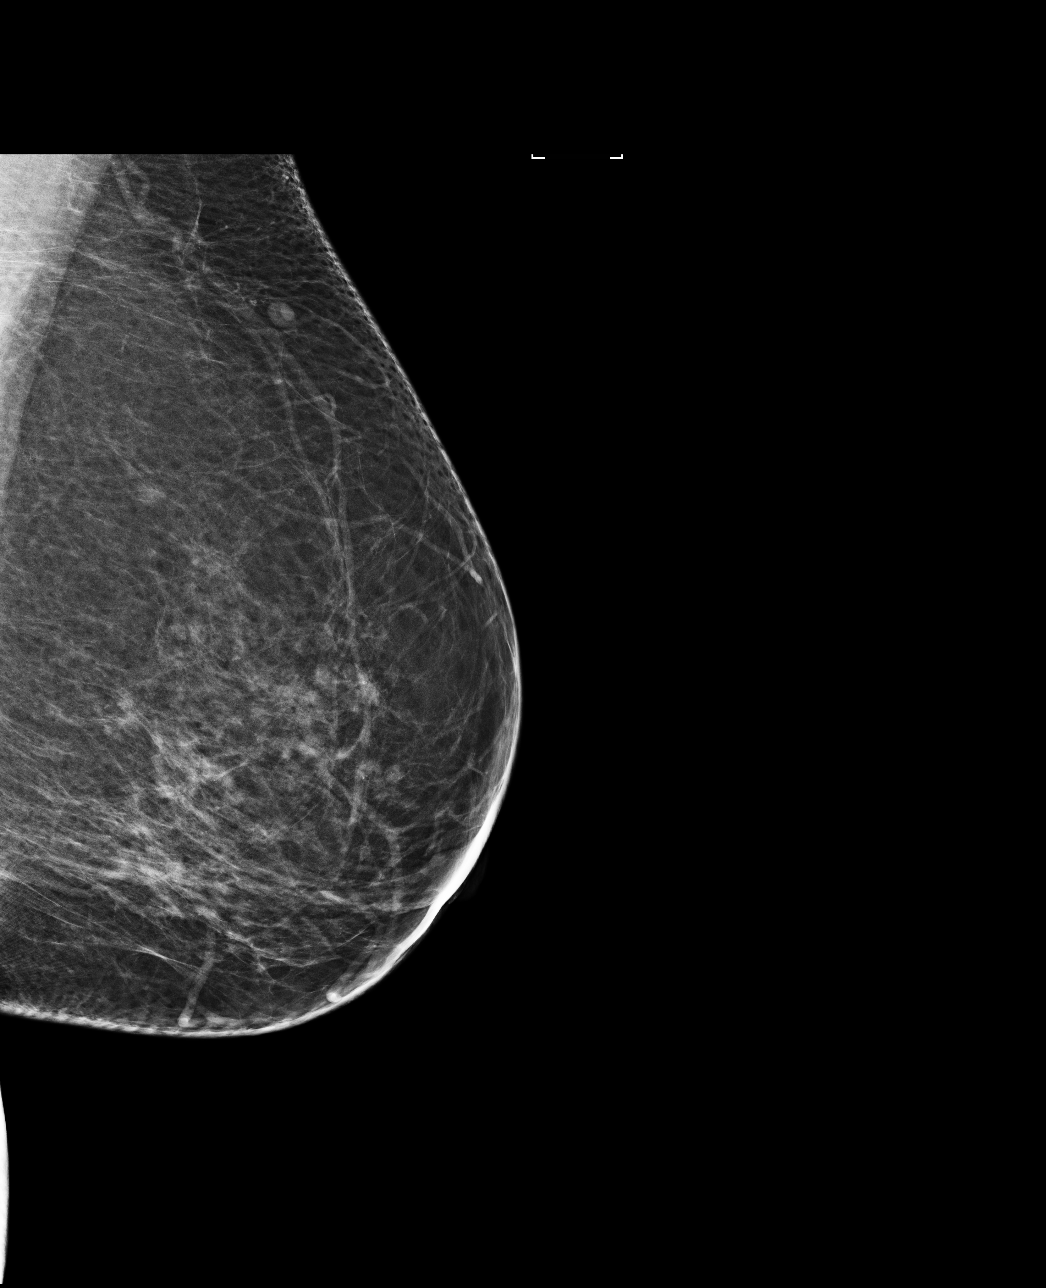

[L CC]
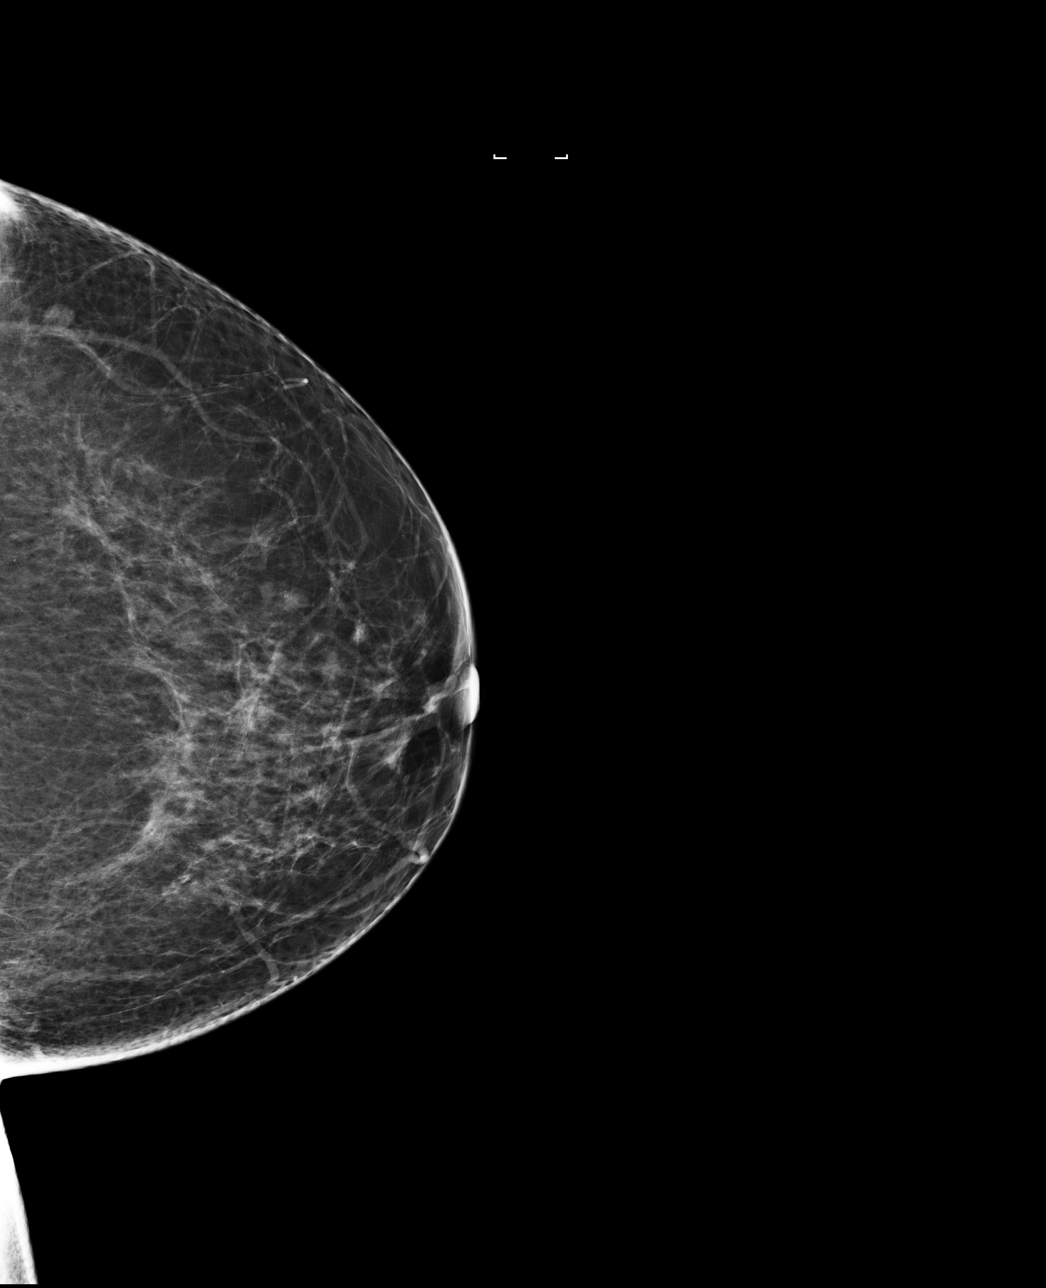

[R CC]
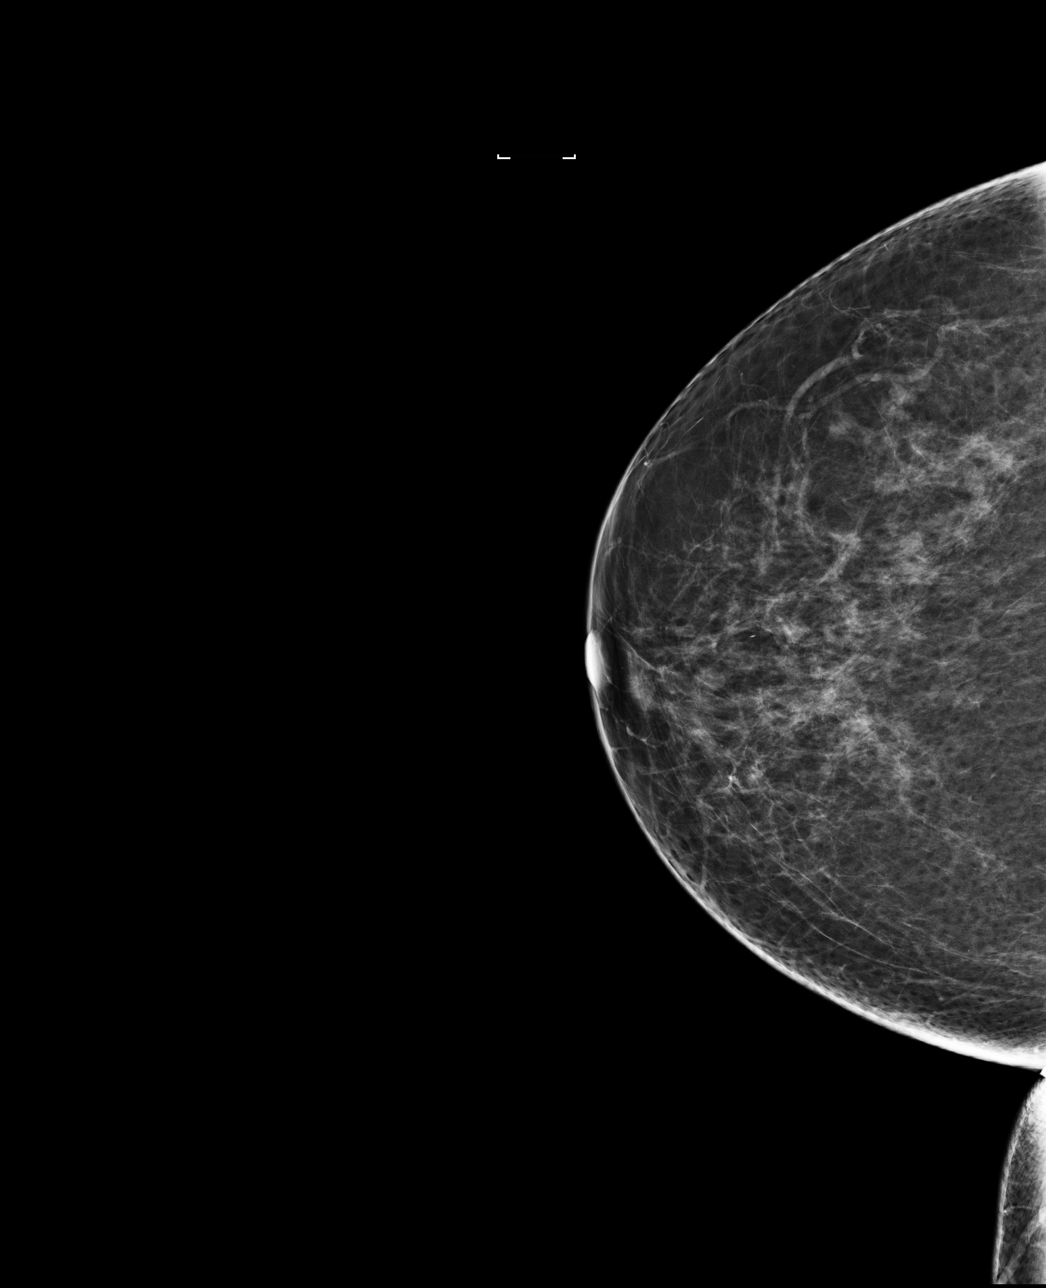

[4 of 4 positions shown; findings below may reference images not displayed]

ACR Breast Density Category b: There are scattered areas of
fibroglandular density.
FINDINGS: There are no findings suspicious for malignancy. Images were
processed with CAD.
IMPRESSION: No mammographic evidence of malignancy. A result letter of this
screening mammogram will be mailed directly to the patient.

RECOMMENDATION:
Screening mammogram in one year. (Code:AS-G-LCT)

BI-RADS CATEGORY  1: Negative.

## 2015-12-29 ENCOUNTER — Telehealth: Payer: Self-pay | Admitting: *Deleted

## 2015-12-29 MED ORDER — METFORMIN HCL 1000 MG PO TABS: 1000.0000 mg | ORAL_TABLET | Freq: Every evening | ORAL | 1 refills | Status: DC

## 2015-12-29 NOTE — Telephone Encounter (Signed)
Patient called stating that she has changed insurance companies and needs a script for Metformin sent to [facility]. Script sent to pharmacy as instructed.

## 2016-01-07 ENCOUNTER — Encounter: Payer: Self-pay | Admitting: Internal Medicine

## 2016-02-05 ENCOUNTER — Other Ambulatory Visit: Payer: Self-pay | Admitting: Internal Medicine

## 2016-02-05 DIAGNOSIS — Z1239 Encounter for other screening for malignant neoplasm of breast: Secondary | ICD-10-CM

## 2016-02-13 ENCOUNTER — Ambulatory Visit: Payer: Self-pay | Admitting: Internal Medicine

## 2016-03-11 ENCOUNTER — Ambulatory Visit
Admission: RE | Admit: 2016-03-11 | Discharge: 2016-03-11 | Disposition: A | Discharge status: Home / Self Care | Payer: BLUE CROSS/BLUE SHIELD | Source: Ambulatory Visit | Attending: Internal Medicine | Admitting: Internal Medicine

## 2016-03-11 DIAGNOSIS — Z1231 Encounter for screening mammogram for malignant neoplasm of breast: Secondary | ICD-10-CM | POA: Insufficient documentation

## 2016-03-11 DIAGNOSIS — Z1239 Encounter for other screening for malignant neoplasm of breast: Secondary | ICD-10-CM

## 2016-03-16 ENCOUNTER — Other Ambulatory Visit: Payer: Self-pay | Admitting: Internal Medicine

## 2016-03-16 DIAGNOSIS — N6320 Unspecified lump in the left breast, unspecified quadrant: Secondary | ICD-10-CM

## 2016-03-16 DIAGNOSIS — R928 Other abnormal and inconclusive findings on diagnostic imaging of breast: Secondary | ICD-10-CM

## 2016-04-01 ENCOUNTER — Ambulatory Visit
Admission: RE | Admit: 2016-04-01 | Discharge: 2016-04-01 | Disposition: A | Discharge status: Home / Self Care | Payer: BLUE CROSS/BLUE SHIELD | Source: Ambulatory Visit | Attending: Internal Medicine | Admitting: Internal Medicine

## 2016-04-01 DIAGNOSIS — N6320 Unspecified lump in the left breast, unspecified quadrant: Secondary | ICD-10-CM | POA: Diagnosis not present

## 2016-04-01 DIAGNOSIS — R928 Other abnormal and inconclusive findings on diagnostic imaging of breast: Secondary | ICD-10-CM

## 2017-03-02 ENCOUNTER — Other Ambulatory Visit: Payer: Self-pay | Admitting: Internal Medicine

## 2017-03-02 DIAGNOSIS — Z1231 Encounter for screening mammogram for malignant neoplasm of breast: Secondary | ICD-10-CM

## 2017-04-06 ENCOUNTER — Ambulatory Visit
Admission: RE | Admit: 2017-04-06 | Discharge: 2017-04-06 | Disposition: A | Discharge status: Home / Self Care | Payer: Medicare Other | Source: Ambulatory Visit | Attending: Internal Medicine | Admitting: Internal Medicine

## 2017-04-06 DIAGNOSIS — Z1231 Encounter for screening mammogram for malignant neoplasm of breast: Secondary | ICD-10-CM | POA: Diagnosis present

## 2017-08-18 ENCOUNTER — Emergency Department
Admission: EM | Admit: 2017-08-18 | Discharge: 2017-08-18 | Disposition: A | Discharge status: Home / Self Care | Payer: BLUE CROSS/BLUE SHIELD | Attending: Emergency Medicine | Admitting: Emergency Medicine

## 2017-08-18 ENCOUNTER — Encounter: Payer: Self-pay | Admitting: *Deleted

## 2017-08-18 DIAGNOSIS — N289 Disorder of kidney and ureter, unspecified: Secondary | ICD-10-CM | POA: Insufficient documentation

## 2017-08-18 DIAGNOSIS — R112 Nausea with vomiting, unspecified: Secondary | ICD-10-CM | POA: Diagnosis not present

## 2017-08-18 DIAGNOSIS — E119 Type 2 diabetes mellitus without complications: Secondary | ICD-10-CM | POA: Insufficient documentation

## 2017-08-18 DIAGNOSIS — Z7984 Long term (current) use of oral hypoglycemic drugs: Secondary | ICD-10-CM | POA: Insufficient documentation

## 2017-08-18 DIAGNOSIS — N3000 Acute cystitis without hematuria: Secondary | ICD-10-CM | POA: Insufficient documentation

## 2017-08-18 DIAGNOSIS — R1030 Lower abdominal pain, unspecified: Secondary | ICD-10-CM | POA: Diagnosis present

## 2017-08-18 LAB — CBC
HEMATOCRIT: 35.9 % (ref 35.0–47.0)
Hemoglobin: 12.9 g/dL (ref 12.0–16.0)
MCH: 31.7 pg (ref 26.0–34.0)
MCHC: 35.9 g/dL (ref 32.0–36.0)
MCV: 88.2 fL (ref 80.0–100.0)
Platelets: 254 10*3/uL (ref 150–440)
RBC: 4.07 MIL/uL (ref 3.80–5.20)
RDW: 13.2 % (ref 11.5–14.5)
WBC: 12.5 10*3/uL — AB (ref 3.6–11.0)

## 2017-08-18 LAB — URINALYSIS, COMPLETE (UACMP) WITH MICROSCOPIC
Bilirubin Urine: NEGATIVE
Glucose, UA: NEGATIVE mg/dL
Ketones, ur: 20 mg/dL — AB
NITRITE: POSITIVE — AB
PH: 5 (ref 5.0–8.0)
Protein, ur: NEGATIVE mg/dL
SPECIFIC GRAVITY, URINE: 1.015 (ref 1.005–1.030)
WBC, UA: >50 WBC/hpf — ABNORMAL HIGH (ref 0–5)

## 2017-08-18 LAB — COMPREHENSIVE METABOLIC PANEL
ALT: 19 U/L (ref 14–54)
ANION GAP: 9 (ref 5–15)
AST: 21 U/L (ref 15–41)
Albumin: 4.2 g/dL (ref 3.5–5.0)
Alkaline Phosphatase: 60 U/L (ref 38–126)
BILIRUBIN TOTAL: 1.5 mg/dL — AB (ref 0.3–1.2)
BUN: 21 mg/dL — AB (ref 6–20)
CO2: 25 mmol/L (ref 22–32)
Calcium: 8.9 mg/dL (ref 8.9–10.3)
Chloride: 99 mmol/L — ABNORMAL LOW (ref 101–111)
Creatinine, Ser: 1.06 mg/dL — ABNORMAL HIGH (ref 0.44–1.00)
GFR, EST NON AFRICAN AMERICAN: 53 mL/min — AB (ref >60)
Glucose, Bld: 167 mg/dL — ABNORMAL HIGH (ref 65–99)
POTASSIUM: 3.7 mmol/L (ref 3.5–5.1)
Sodium: 133 mmol/L — ABNORMAL LOW (ref 135–145)
TOTAL PROTEIN: 7.8 g/dL (ref 6.5–8.1)

## 2017-08-18 LAB — LIPASE, BLOOD: Lipase: 32 U/L (ref 11–51)

## 2017-08-18 MED ORDER — ONDANSETRON 4 MG PO TBDP
4.0000 mg | ORAL_TABLET | Freq: Once | ORAL | Status: AC | PRN
  Administered 2017-08-18: 4 mg via ORAL
  Filled 2017-08-18: qty 1

## 2017-08-18 MED ORDER — SULFAMETHOXAZOLE-TRIMETHOPRIM 800-160 MG PO TABS: 1.0000 | ORAL_TABLET | Freq: Two times a day (BID) | ORAL | 0 refills | Status: AC

## 2017-08-18 MED ORDER — ONDANSETRON HCL 4 MG/2ML IJ SOLN
4.0000 mg | Freq: Once | INTRAMUSCULAR | Status: AC
Start: 2017-08-18 — End: 2017-08-18
  Administered 2017-08-18: 4 mg via INTRAVENOUS
  Filled 2017-08-18: qty 2

## 2017-08-18 MED ORDER — SODIUM CHLORIDE 0.9 % IV SOLN
1.0000 g | Freq: Once | INTRAVENOUS | Status: AC
  Administered 2017-08-18: 1 g via INTRAVENOUS
  Filled 2017-08-18 (×2): qty 10

## 2017-08-18 MED ORDER — ONDANSETRON 4 MG PO TBDP: 4.0000 mg | ORAL_TABLET | Freq: Three times a day (TID) | ORAL | 0 refills | Status: DC | PRN

## 2017-08-18 MED ORDER — KETOROLAC TROMETHAMINE 30 MG/ML IJ SOLN
30.0000 mg | Freq: Once | INTRAMUSCULAR | Status: AC
  Administered 2017-08-18: 30 mg via INTRAVENOUS
  Filled 2017-08-18: qty 1

## 2017-08-18 MED ORDER — KETOROLAC TROMETHAMINE 10 MG PO TABS: 10.0000 mg | ORAL_TABLET | Freq: Three times a day (TID) | ORAL | 0 refills | Status: DC | PRN

## 2017-08-18 MED ORDER — SODIUM CHLORIDE 0.9 % IV BOLUS
1000.0000 mL | Freq: Once | INTRAVENOUS | Status: AC
  Administered 2017-08-18: 1000 mL via INTRAVENOUS

## 2017-08-18 NOTE — Discharge Instructions (Addendum)
Please drink plenty of fluids stay well-hydrated and to help clear your urinary tract infection.  Please take the entire course of antibiotics, even if you are feeling better.  You may take Toradol or Tylenol for pain.  If you take Toradol, please take with food and do not take other NSAID medications including Advil, Motrin, ibuprofen or Aleve.  Zofran is for nausea or vomiting.  Please have your primary care physician follow-up the results of your urine culture.  Return to the emergency department if you develop severe pain, inability to keep down fluids, lightheadedness or fainting, fever, or any other symptoms concerning to you.

## 2017-08-18 NOTE — ED Triage Notes (Signed)
Pt reports lower abdominal pain that started yesterday. Pt reports pain is constant and describes it as sharp in nature. Pt reports pain is across lower abdomen. Pt reports nausea and chills, denies vomitting and diarrhea. Last BM was yesterday normal per pt.

## 2017-08-18 NOTE — ED Notes (Signed)
Pt ambulatory to room 37 without slow gait, appears uncomfortable; st lower abd pain since yesterday with no accomp symptoms; st hx of same with UTIs; +BS, abd soft/nondist/nontender; pt accomp by husband

## 2017-08-18 NOTE — ED Provider Notes (Signed)
[facility] Emergency Department Provider Note  ____________________________________________  Time seen: Approximately 8:09 PM  I have reviewed the triage vital signs and the nursing notes.   HISTORY  Chief Complaint Abdominal Johnson and Isabel    HPI Isabel WIGGLESWORTH is a 67 y.o. female with a history of recurrent Johnson, Isabel Johnson, Isabel Johnson, Isabel Johnson yesterday she has been having a diffuse nonfocal sharp lower abdominal Johnson, and several episodes of Isabel and vomiting. she denies fever chills, change in vaginal discharge, constipation or diarrhea.  She has not had any dysuria, urinary frequency.  She has not tried anything for Johnson.  Past Medical History:  Diagnosis Date  . Endometrial polyp   . Endometriosis   . Hyperlipidemia   . PMB (postmenopausal bleeding)   . PONV (postoperative Isabel and vomiting)   . Type 2 diabetes mellitus ([place])   . Wears contact lenses     Patient Active Problem List   Diagnosis Date Noted  . Diabetes mellitus without complication ([place])   . Esophageal dysphagia 03/08/2014  . Routine general medical examination at a health care facility 12/24/2010  . ENDOMETRIOSIS NOS 07/01/2006    Past Surgical History:  Procedure Laterality Date  . DILATATION & CURETTAGE/HYSTEROSCOPY WITH MYOSURE N/A 03/17/2015   Procedure: DILATATION & CURETTAGE/HYSTEROSCOPY WITH MYOSURE;  Surgeon: Arvella Nigh, MD;  Location: [place];  Service: Gynecology;  Laterality: N/A;  . LAPAROSCOPY  1980   endometriosis    Current Outpatient Rx  . Order #: [id number] Class: Print  . Order #: [id number] Class: Normal  . Order #: [id number] Class: Print  . Order #: [id number] Class: Print  . Order #: [id number] Class: Normal    Allergies Patient has no known allergies.  Family History  Problem Relation Age of Onset  . Diabetes Father   . Hypertension Father   . Stroke  Maternal Grandmother   . Heart disease Maternal Grandfather   . Colon cancer Sister   . Cancer Sister        colon  . Breast cancer Neg Hx     Social History Social History   Tobacco Use  . Smoking status: Never Smoker  . Smokeless tobacco: Never Used  Substance Use Topics  . Alcohol use: Yes  . Drug use: No    Review of Systems Constitutional: No fever/chills. Eyes: No visual changes. ENT: No sore throat. No congestion or rhinorrhea. Cardiovascular: Denies chest Johnson. Denies palpitations. Respiratory: Denies shortness of breath.  No cough. Gastrointestinal: Positive diffuse lower abdominal Johnson.  Positive Isabel, positive vomiting.  No diarrhea.  No constipation. Genitourinary: Negative for dysuria.  No urinary frequency.  No hematuria.  No change in vaginal discharge. Musculoskeletal: Negative for back Johnson. Skin: Negative for rash. Neurological: Negative for headaches. No focal numbness, tingling or weakness.     ____________________________________________   PHYSICAL EXAM:  VITAL SIGNS: ED Triage Vitals [08/18/17 1850]  Enc Vitals Group     BP (!) 147/51     Pulse Rate 78     Resp 20     Temp 99.1 F (37.3 C)     Temp Source Oral     SpO2 (!) 5 %     Weight 190 lb (86.2 kg)     Height 5\' 7"  (1.702 m)     Head Circumference      Peak Flow      Johnson Score 5     Johnson Loc  Johnson Edu?      Excl. in [place]?     Constitutional: Alert and oriented.  Answers questions appropriately.  The patient is sitting on the edge of the bed holding her lower abdomen but nontoxic Eyes: Conjunctivae are normal.  EOMI. No scleral icterus. Head: Atraumatic. Nose: No congestion/rhinnorhea. Mouth/Throat: Mucous membranes are moist.  Neck: No stridor.  Supple.   Cardiovascular: Normal rate, regular rhythm. No murmurs, rubs or gallops.  Respiratory: Normal respiratory effort.  No accessory muscle use or retractions. Lungs CTAB.  No wheezes, rales or ronchi. Gastrointestinal:  Soft, and nondistended.  Diffuse tenderness to palpation in the lower abdomen without focality.  No guarding or rebound.  No peritoneal signs. Musculoskeletal: No LE edema. Neurologic:  A&Ox3.  Speech is clear.  Face and smile are symmetric.  EOMI.  Moves all extremities well. Skin:  Skin is warm, dry and intact. No rash noted. Psychiatric: Mood and affect are normal. Speech and behavior are normal.  Normal judgement.  ____________________________________________   LABS (all labs ordered are listed, but only abnormal results are displayed)  Labs Reviewed  COMPREHENSIVE METABOLIC PANEL - Abnormal; Notable for the following components:      Result Value   Sodium 133 (*)    Chloride 99 (*)    Glucose, Bld 167 (*)    BUN 21 (*)    Creatinine, Ser 1.06 (*)    Total Bilirubin 1.5 (*)    GFR calc non Af Amer 53 (*)    All other components within normal limits  CBC - Abnormal; Notable for the following components:   WBC 12.5 (*)    All other components within normal limits  URINALYSIS, COMPLETE (UACMP) WITH MICROSCOPIC - Abnormal; Notable for the following components:   Color, Urine YELLOW (*)    APPearance CLOUDY (*)    Hgb urine dipstick MODERATE (*)    Ketones, ur 20 (*)    Nitrite POSITIVE (*)    Leukocytes, UA LARGE (*)    WBC, UA >50 (*)    Bacteria, UA MANY (*)    Non Squamous Epithelial 0-5 (*)    All other components within normal limits  URINE CULTURE  LIPASE, BLOOD   ____________________________________________  EKG  Not indicated ____________________________________________  RADIOLOGY  No results found.  ____________________________________________   PROCEDURES  Procedure(s) performed: None  Procedures  Critical Care performed: No ____________________________________________   INITIAL IMPRESSION / ASSESSMENT AND PLAN / ED COURSE  Pertinent labs & imaging results that were available during my care of the patient were reviewed by me and considered in  my medical decision making (see chart for details).  67 y.o. female with a history of recurrent UTIs Isabel with lower abdominal Johnson, Isabel and vomiting.  Overall, the patient is hemodynamically stable and afebrile here.  She does have laboratory studies that are indicative of a urinary tract infection.  In addition, she appears to be dehydrated and her laboratory studies with a renal insufficiency as well as mild hyponatremia with a sodium of 133.  Here, I will treat the patient with Rocephin, as well as intravenous fluids.  She received Johnson medications and antiemetic.  We will confirm that she is able to keep down fluids prior to discharge.  A urine culture has been sent.  The patient understands follow-up as well as return precautions.  ____________________________________________  FINAL CLINICAL IMPRESSION(S) / ED DIAGNOSES  Final diagnoses:  Renal insufficiency  Acute cystitis without hematuria  Isabel and vomiting, intractability of vomiting not  specified, unspecified vomiting type         NEW MEDICATIONS STARTED DURING THIS VISIT:  New Prescriptions   KETOROLAC (TORADOL) 10 MG TABLET    Take 1 tablet (10 mg total) by mouth every 8 (eight) hours as needed for moderate Johnson (with food).   ONDANSETRON (ZOFRAN ODT) 4 MG DISINTEGRATING TABLET    Take 1 tablet (4 mg total) by mouth every 8 (eight) hours as needed for Isabel or vomiting.   SULFAMETHOXAZOLE-TRIMETHOPRIM (BACTRIM DS,SEPTRA DS) 800-160 MG TABLET    Take 1 tablet by mouth 2 (two) times daily for 10 days.      Eula Listen, MD 08/18/17 2014

## 2017-08-18 NOTE — ED Notes (Signed)
Pt reports feeling much better now, denies nausea and pain decreased 2/10; taking sips water

## 2017-08-21 LAB — URINE CULTURE

## 2018-04-04 ENCOUNTER — Other Ambulatory Visit: Payer: Self-pay | Admitting: Internal Medicine

## 2018-04-04 DIAGNOSIS — Z1231 Encounter for screening mammogram for malignant neoplasm of breast: Secondary | ICD-10-CM

## 2018-04-19 ENCOUNTER — Ambulatory Visit
Admission: RE | Admit: 2018-04-19 | Discharge: 2018-04-19 | Disposition: A | Discharge status: Home / Self Care | Payer: BLUE CROSS/BLUE SHIELD | Source: Ambulatory Visit | Attending: Internal Medicine | Admitting: Internal Medicine

## 2018-04-19 DIAGNOSIS — Z1231 Encounter for screening mammogram for malignant neoplasm of breast: Secondary | ICD-10-CM | POA: Diagnosis present

## 2019-06-06 ENCOUNTER — Other Ambulatory Visit: Payer: Self-pay | Admitting: Internal Medicine

## 2019-06-06 DIAGNOSIS — Z1231 Encounter for screening mammogram for malignant neoplasm of breast: Secondary | ICD-10-CM

## 2019-06-21 ENCOUNTER — Ambulatory Visit
Admission: RE | Admit: 2019-06-21 | Discharge: 2019-06-21 | Disposition: A | Discharge status: Home / Self Care | Payer: BC Managed Care – PPO | Source: Ambulatory Visit | Attending: Internal Medicine | Admitting: Internal Medicine

## 2019-06-21 DIAGNOSIS — Z1231 Encounter for screening mammogram for malignant neoplasm of breast: Secondary | ICD-10-CM | POA: Diagnosis present

## 2020-07-21 ENCOUNTER — Other Ambulatory Visit: Payer: Self-pay | Admitting: Internal Medicine

## 2020-07-21 DIAGNOSIS — Z1231 Encounter for screening mammogram for malignant neoplasm of breast: Secondary | ICD-10-CM

## 2020-08-04 ENCOUNTER — Ambulatory Visit
Admission: RE | Admit: 2020-08-04 | Discharge: 2020-08-04 | Disposition: A | Discharge status: Home / Self Care | Payer: BC Managed Care – PPO | Source: Ambulatory Visit | Attending: Internal Medicine | Admitting: Internal Medicine

## 2020-08-04 ENCOUNTER — Other Ambulatory Visit: Payer: Self-pay

## 2020-08-04 DIAGNOSIS — Z1231 Encounter for screening mammogram for malignant neoplasm of breast: Secondary | ICD-10-CM | POA: Insufficient documentation

## 2020-08-06 ENCOUNTER — Other Ambulatory Visit: Payer: Self-pay | Admitting: Internal Medicine

## 2020-08-06 DIAGNOSIS — R928 Other abnormal and inconclusive findings on diagnostic imaging of breast: Secondary | ICD-10-CM

## 2020-08-06 DIAGNOSIS — N6310 Unspecified lump in the right breast, unspecified quadrant: Secondary | ICD-10-CM

## 2020-08-08 ENCOUNTER — Ambulatory Visit
Admission: RE | Admit: 2020-08-08 | Discharge: 2020-08-08 | Disposition: A | Discharge status: Home / Self Care | Payer: BC Managed Care – PPO | Source: Ambulatory Visit | Attending: Internal Medicine | Admitting: Internal Medicine

## 2020-08-08 ENCOUNTER — Other Ambulatory Visit: Payer: Self-pay

## 2020-08-08 DIAGNOSIS — R928 Other abnormal and inconclusive findings on diagnostic imaging of breast: Secondary | ICD-10-CM | POA: Insufficient documentation

## 2020-08-08 DIAGNOSIS — N6310 Unspecified lump in the right breast, unspecified quadrant: Secondary | ICD-10-CM | POA: Insufficient documentation

## 2020-08-12 ENCOUNTER — Other Ambulatory Visit: Payer: Self-pay | Admitting: Internal Medicine

## 2020-08-12 DIAGNOSIS — R928 Other abnormal and inconclusive findings on diagnostic imaging of breast: Secondary | ICD-10-CM

## 2020-08-15 ENCOUNTER — Other Ambulatory Visit: Payer: Self-pay

## 2020-08-15 ENCOUNTER — Ambulatory Visit
Admission: RE | Admit: 2020-08-15 | Discharge: 2020-08-15 | Disposition: A | Discharge status: Home / Self Care | Payer: BC Managed Care – PPO | Source: Ambulatory Visit | Attending: Internal Medicine | Admitting: Internal Medicine

## 2020-08-15 DIAGNOSIS — R928 Other abnormal and inconclusive findings on diagnostic imaging of breast: Secondary | ICD-10-CM | POA: Insufficient documentation

## 2020-08-15 HISTORY — PX: BREAST BIOPSY: SHX20

## 2020-08-18 LAB — SURGICAL PATHOLOGY

## 2021-02-05 ENCOUNTER — Ambulatory Visit: Payer: BC Managed Care – PPO | Admitting: Dermatology

## 2021-02-05 ENCOUNTER — Encounter: Payer: Self-pay | Admitting: Dermatology

## 2021-02-05 ENCOUNTER — Other Ambulatory Visit: Payer: Self-pay

## 2021-02-05 DIAGNOSIS — L649 Androgenic alopecia, unspecified: Secondary | ICD-10-CM

## 2021-02-05 DIAGNOSIS — D229 Melanocytic nevi, unspecified: Secondary | ICD-10-CM

## 2021-02-05 DIAGNOSIS — D1800 Hemangioma unspecified site: Secondary | ICD-10-CM

## 2021-02-05 DIAGNOSIS — Z1283 Encounter for screening for malignant neoplasm of skin: Secondary | ICD-10-CM

## 2021-02-05 DIAGNOSIS — L578 Other skin changes due to chronic exposure to nonionizing radiation: Secondary | ICD-10-CM

## 2021-02-05 DIAGNOSIS — L650 Telogen effluvium: Secondary | ICD-10-CM

## 2021-02-05 DIAGNOSIS — L719 Rosacea, unspecified: Secondary | ICD-10-CM

## 2021-02-05 DIAGNOSIS — L304 Erythema intertrigo: Secondary | ICD-10-CM

## 2021-02-05 DIAGNOSIS — L814 Other melanin hyperpigmentation: Secondary | ICD-10-CM

## 2021-02-05 DIAGNOSIS — L821 Other seborrheic keratosis: Secondary | ICD-10-CM

## 2021-02-05 MED ORDER — IVERMECTIN 1 % EX CREA: TOPICAL_CREAM | CUTANEOUS | 3 refills | Status: DC

## 2021-02-05 MED ORDER — RHOFADE 1 % EX CREA: TOPICAL_CREAM | CUTANEOUS | 3 refills | Status: DC

## 2021-02-05 NOTE — Patient Instructions (Signed)

## 2021-02-05 NOTE — Progress Notes (Signed)
New Patient Visit  Subjective  Isabel Johnson is a 70 y.o. female who presents for the following: Annual Exam (Hx of AK on the foot - patient had a lesion on her chest but it has since resolved after scheduling the appointment. ). The patient presents for Total-Body Skin Exam (TBSE) for skin cancer screening and mole check.  The following portions of the chart were reviewed this encounter and updated as appropriate:   Tobacco  Allergies  Meds  Problems  Med Hx  Surg Hx  Fam Hx      Review of Systems:  No other skin or systemic complaints except as noted in HPI or Assessment and Plan.  Objective  Well appearing patient in no apparent distress; mood and affect are within normal limits.  A full examination was performed including scalp, head, eyes, ears, nose, lips, neck, chest, axillae, abdomen, back, buttocks, bilateral upper extremities, bilateral lower extremities, hands, feet, fingers, toes, fingernails, and toenails. All findings within normal limits unless otherwise noted below.  Lower abdomen Erythematous patches  Face Mid face erythema with telangiectasias and rare inflammatory papule         Scalp Diffuse thinning of hair, positive hair pull test. widening of the midline part             Assessment & Plan  Erythema intertrigo Lower abdomen  Intertrigo is a chronic recurrent rash that occurs in skin fold areas that may be associated with friction; heat; moisture; yeast; fungus; and bacteria.  It is exacerbated by increased movement / activity; sweating; and higher atmospheric temperature.  Not bothersome to patient - no tx needed, but discussed OTC powders to keep area dry.   Rosacea Face  Chronic condition with duration or expected duration over one year. Condition is bothersome to patient. Currently flared.  No grittiness or irritation of the eyes per patient -   Rosacea is a chronic progressive skin condition usually affecting the face of  adults, causing redness and/or acne bumps. It is treatable but not curable. It sometimes affects the eyes (ocular rosacea) as well. It may respond to topical and/or systemic medication and can flare with stress, sun exposure, alcohol, exercise and some foods.  Daily application of broad spectrum spf 30+ sunscreen to face is recommended to reduce flares.  Start Soolantra QAM and Rhofade QAM. If not covered or too expensive recommend Skin Medicinals mix.   Ivermectin (SOOLANTRA) 1 % CREA - Face Apply a thin coat to the face QAM.  Oxymetazoline HCl (RHOFADE) 1 % CREA - Face Apply a thin coat to the entire face QAM.  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 Given longer duration of increased hair loss, will also order labs to r/o underlying vitamin or iron deficiency or thyroid disease  Also with androgenetic alopecia  Recommend minoxidil 5% (Rogaine for men) solution or foam to be applied to the scalp and left in. This should ideally be used twice daily for best results but it helps with hair regrowth when used at least three times per week. Rogaine initially can cause increased hair shedding for the first few weeks but this will stop with continued use. In studies, people who used minoxidil (Rogaine) for at least 6 months had thicker hair than people who did not. Minoxidil topical (Rogaine) only works as long as it continues to be used. If if it is no longer used then the hair it has been helping to regrow can fall  out. Minoxidil topical (Rogaine) can cause increased facial hair growth which can usually be managed easily with a battery-operated hair trimmer. If facial hair growth is bothersome, switching to the 2% women's version can decrease the risk of unwanted facial hair growth.  Plan to check TSH with reflux to T4, Ferritin, and Vitamin D levels. Consider oral Minoxidil or Finasteride if labs WNL.    Vitamin D, 25-hydroxy - Scalp  Ferritin -  Scalp  TSH Rfx on Abnormal to Free T4 - Scalp  Lentigines - Scattered tan macules - Due to sun exposure - Benign-appearing, observe - Recommend daily broad spectrum sunscreen SPF 30+ to sun-exposed areas, reapply every 2 hours as needed. - Call for any changes  Seborrheic Keratoses - Stuck-on, waxy, tan-brown papules and/or plaques  - Benign-appearing - Discussed benign etiology and prognosis. - Observe - Call for any changes  Melanocytic Nevi - Tan-brown and/or pink-flesh-colored symmetric macules and papules - Benign appearing on exam today - Observation - Call clinic for new or changing moles - Recommend daily use of broad spectrum spf 30+ sunscreen to sun-exposed areas.   Hemangiomas - Red papules - Discussed benign nature - Observe - Call for any changes  Actinic Damage - Chronic condition, secondary to cumulative UV/sun exposure - diffuse scaly erythematous macules with underlying dyspigmentation - Recommend daily broad spectrum sunscreen SPF 30+ to sun-exposed areas, reapply every 2 hours as needed.  - Staying in the shade or wearing long sleeves, sun glasses (UVA+UVB protection) and wide brim hats (4-inch brim around the entire circumference of the hat) are also recommended for sun protection.  - Call for new or changing lesions.  Skin cancer screening performed today.  Return in about 4 months (around 06/05/2021) for recheck hairloss .  Luther Redo, CMA, am acting as scribe for Forest Gleason, MD .  Documentation: I have reviewed the above documentation for accuracy and completeness, and I agree with the above.  Forest Gleason, MD

## 2021-02-10 ENCOUNTER — Encounter: Payer: Self-pay | Admitting: Dermatology

## 2021-05-14 ENCOUNTER — Ambulatory Visit: Payer: BC Managed Care – PPO | Admitting: Dermatology

## 2021-08-14 ENCOUNTER — Other Ambulatory Visit: Payer: Self-pay | Admitting: Internal Medicine

## 2021-08-14 DIAGNOSIS — Z1231 Encounter for screening mammogram for malignant neoplasm of breast: Secondary | ICD-10-CM

## 2021-08-17 ENCOUNTER — Other Ambulatory Visit: Payer: Self-pay | Admitting: Internal Medicine

## 2021-08-17 DIAGNOSIS — N6489 Other specified disorders of breast: Secondary | ICD-10-CM

## 2021-08-19 ENCOUNTER — Ambulatory Visit
Admission: RE | Admit: 2021-08-19 | Discharge: 2021-08-19 | Disposition: A | Discharge status: Home / Self Care | Payer: BC Managed Care – PPO | Source: Ambulatory Visit | Attending: Internal Medicine | Admitting: Internal Medicine

## 2021-08-19 DIAGNOSIS — N6489 Other specified disorders of breast: Secondary | ICD-10-CM | POA: Diagnosis present

## 2022-04-06 ENCOUNTER — Encounter: Payer: Self-pay | Admitting: Dermatology

## 2022-04-06 ENCOUNTER — Ambulatory Visit (INDEPENDENT_AMBULATORY_CARE_PROVIDER_SITE_OTHER): Payer: Medicare Other | Admitting: Dermatology

## 2022-04-06 VITALS — BP 141/64 | HR 64

## 2022-04-06 DIAGNOSIS — C44629 Squamous cell carcinoma of skin of left upper limb, including shoulder: Secondary | ICD-10-CM | POA: Diagnosis not present

## 2022-04-06 DIAGNOSIS — D492 Neoplasm of unspecified behavior of bone, soft tissue, and skin: Secondary | ICD-10-CM

## 2022-04-06 DIAGNOSIS — C4492 Squamous cell carcinoma of skin, unspecified: Secondary | ICD-10-CM

## 2022-04-06 HISTORY — DX: Squamous cell carcinoma of skin, unspecified: C44.92

## 2022-04-06 NOTE — Patient Instructions (Addendum)
Electrodesiccation and Curettage ("Scrape and Burn") Wound Care Instructions  Leave the original bandage on for 24 hours if possible.  If the bandage becomes soaked or soiled before that time, it is OK to remove it and examine the wound.  A small amount of post-operative bleeding is normal.  If excessive bleeding occurs, remove the bandage, place gauze over the site and apply continuous pressure (no peeking) over the area for 30 minutes. If this does not work, please call our clinic as soon as possible or page your doctor if it is after hours.   Once a day, cleanse the wound with soap and water. It is fine to shower. If a thick crust develops you may use a Q-tip dipped into dilute hydrogen peroxide (mix 1:1 with water) to dissolve it.  Hydrogen peroxide can slow the healing process, so use it only as needed.    After washing, apply petroleum jelly (Vaseline) or an antibiotic ointment if your doctor prescribed one for you, followed by a bandage.    For best healing, the wound should be covered with a layer of ointment at all times. If you are not able to keep the area covered with a bandage to hold the ointment in place, this may mean re-applying the ointment several times a day.  Continue this wound care until the wound has healed and is no longer open. It may take several weeks for the wound to heal and close.  Itching and mild discomfort is normal during the healing process.  If you have any discomfort, you can take Tylenol (acetaminophen) or ibuprofen as directed on the bottle. (Please do not take these if you have an allergy to them or cannot take them for another reason).  Some redness, tenderness and white or yellow material in the wound is normal healing.  If the area becomes very sore and red, or develops a thick yellow-green material (pus), it may be infected; please notify us.    Wound healing continues for up to one year following surgery. It is not unusual to experience pain in the scar  from time to time during the interval.  If the pain becomes severe or the scar thickens, you should notify the office.    A slight amount of redness in a scar is expected for the first six months.  After six months, the redness will fade and the scar will soften and fade.  The color difference becomes less noticeable with time.  If there are any problems, return for a post-op surgery check at your earliest convenience.  To improve the appearance of the scar, you can use silicone scar gel, cream, or sheets (such as Mederma or Serica) every night for up to one year. These are available over the counter (without a prescription).  Please call our office at [phone] for any questions or concerns.     Due to recent changes in healthcare laws, you may see results of your pathology and/or laboratory studies on MyChart before the doctors have had a chance to review them. We understand that in some cases there may be results that are confusing or concerning to you. Please understand that not all results are received at the same time and often the doctors may need to interpret multiple results in order to provide you with the best plan of care or course of treatment. Therefore, we ask that you please give us 2 business days to thoroughly review all your results before contacting the office for clarification. Should   we see a critical lab result, you will be contacted sooner.   If You Need Anything After Your Visit  If you have any questions or concerns for your doctor, please call our main line at [phone] and press option 4 to reach your doctor's medical assistant. If no one answers, please leave a voicemail as directed and we will return your call as soon as possible. Messages left after 4 pm will be answered the following business day.   You may also send us a message via MyChart. We typically respond to MyChart messages within 1-2 business days.  For prescription refills, please ask your  pharmacy to contact our office. Our fax number is [phone].  If you have an urgent issue when the clinic is closed that cannot wait until the next business day, you can page your doctor at the number below.    Please note that while we do our best to be available for urgent issues outside of office hours, we are not available 24/7.   If you have an urgent issue and are unable to reach us, you may choose to seek medical care at your doctor's office, retail clinic, urgent care center, or emergency room.  If you have a medical emergency, please immediately call 911 or go to the emergency department.  Pager Numbers  - Dr. Kowalski: [phone]  - Dr. Moye: [phone]  - Dr. Stewart: [phone]  In the event of inclement weather, please call our main line at [phone] for an update on the status of any delays or closures.  Dermatology Medication Tips: Please keep the boxes that topical medications come in in order to help keep track of the instructions about where and how to use these. Pharmacies typically print the medication instructions only on the boxes and not directly on the medication tubes.   If your medication is too expensive, please contact our office at [phone] option 4 or send us a message through MyChart.   We are unable to tell what your co-pay for medications will be in advance as this is different depending on your insurance coverage. However, we may be able to find a substitute medication at lower cost or fill out paperwork to get insurance to cover a needed medication.   If a prior authorization is required to get your medication covered by your insurance company, please allow us 1-2 business days to complete this process.  Drug prices often vary depending on where the prescription is filled and some pharmacies may offer cheaper prices.  The website www.goodrx.com contains coupons for medications through different pharmacies. The prices here do not  account for what the cost may be with help from insurance (it may be cheaper with your insurance), but the website can give you the price if you did not use any insurance.  - You can print the associated coupon and take it with your prescription to the pharmacy.  - You may also stop by our office during regular business hours and pick up a GoodRx coupon card.  - If you need your prescription sent electronically to a different pharmacy, notify our office through [facility] MyChart or by phone at [phone] option 4.     Si Usted Necesita Algo Despus de Su Visita  Tambin puede enviarnos un mensaje a travs de MyChart. Por lo general respondemos a los mensajes de MyChart en el transcurso de 1 a 2 das hbiles.  Para renovar recetas, por favor pida a su farmacia que se ponga en   contacto con nuestra oficina. Nuestro nmero de fax es el [phone].  Si tiene un asunto urgente cuando la clnica est cerrada y que no puede esperar hasta el siguiente da hbil, puede llamar/localizar a su doctor(a) al nmero que aparece a continuacin.   Por favor, tenga en cuenta que aunque hacemos todo lo posible para estar disponibles para asuntos urgentes fuera del horario de oficina, no estamos disponibles las 24 horas del da, los 7 das de la semana.   Si tiene un problema urgente y no puede comunicarse con nosotros, puede optar por buscar atencin mdica  en el consultorio de su doctor(a), en una clnica privada, en un centro de atencin urgente o en una sala de emergencias.  Si tiene una emergencia mdica, por favor llame inmediatamente al 911 o vaya a la sala de emergencias.  Nmeros de bper  - Dr. Kowalski: [phone]  - Dra. Moye: [phone]  - Dra. Stewart: [phone]  En caso de inclemencias del tiempo, por favor llame a nuestra lnea principal al [phone] para una actualizacin sobre el estado de cualquier retraso o cierre.  Consejos para la medicacin en dermatologa: Por  favor, guarde las cajas en las que vienen los medicamentos de uso tpico para ayudarle a seguir las instrucciones sobre dnde y cmo usarlos. Las farmacias generalmente imprimen las instrucciones del medicamento slo en las cajas y no directamente en los tubos del medicamento.   Si su medicamento es muy caro, por favor, pngase en contacto con nuestra oficina llamando al [phone] y presione la opcin 4 o envenos un mensaje a travs de MyChart.   No podemos decirle cul ser su copago por los medicamentos por adelantado ya que esto es diferente dependiendo de la cobertura de su seguro. Sin embargo, es posible que podamos encontrar un medicamento sustituto a menor costo o llenar un formulario para que el seguro cubra el medicamento que se considera necesario.   Si se requiere una autorizacin previa para que su compaa de seguros cubra su medicamento, por favor permtanos de 1 a 2 das hbiles para completar este proceso.  Los precios de los medicamentos varan con frecuencia dependiendo del lugar de dnde se surte la receta y alguna farmacias pueden ofrecer precios ms baratos.  El sitio web www.goodrx.com tiene cupones para medicamentos de diferentes farmacias. Los precios aqu no tienen en cuenta lo que podra costar con la ayuda del seguro (puede ser ms barato con su seguro), pero el sitio web puede darle el precio si no utiliz ningn seguro.  - Puede imprimir el cupn correspondiente y llevarlo con su receta a la farmacia.  - Tambin puede pasar por nuestra oficina durante el horario de atencin regular y recoger una tarjeta de cupones de GoodRx.  - Si necesita que su receta se enve electrnicamente a una farmacia diferente, informe a nuestra oficina a travs de MyChart de [facility] o por telfono llamando al [phone] y presione la opcin 4.  

## 2022-04-06 NOTE — Progress Notes (Signed)
Follow-Up Visit   Subjective  Isabel Johnson is a 72 y.o. female who presents for the following: lesion (C/O mole growing. Left forearm. Dur: 2 weeks).  The patient has spots, moles and lesions to be evaluated, some may be new or changing and the patient has concerns that these could be cancer.  The following portions of the chart were reviewed this encounter and updated as appropriate:  Tobacco  Allergies  Meds  Problems  Med Hx  Surg Hx  Fam Hx      Review of Systems: No other skin or systemic complaints except as noted in HPI or Assessment and Plan.   Objective  Well appearing patient in no apparent distress; mood and affect are within normal limits.  A focused examination was performed including left arm. Relevant physical exam findings are noted in the Assessment and Plan.  Left Distal Dorsal Forearm 1.0 cm pink nodule        Assessment & Plan  Neoplasm of skin Left Distal Dorsal Forearm  Epidermal / dermal shaving  Lesion diameter (cm):  1 Informed consent: discussed and consent obtained   Patient was prepped and draped in usual sterile fashion: Area prepped with alcohol. Anesthesia: the lesion was anesthetized in a standard fashion   Anesthetic:  1% lidocaine w/ epinephrine 1-100,000 buffered w/ 8.4% NaHCO3 Instrument used: flexible razor blade   Hemostasis achieved with: pressure, aluminum chloride and electrodesiccation   Outcome: patient tolerated procedure well   Post-procedure details: wound care instructions given   Post-procedure details comment:  Ointment and small bandage applied  Destruction of lesion  Destruction method: electrodesiccation and curettage   Informed consent: discussed and consent obtained   Timeout:  patient name, date of birth, surgical site, and procedure verified Anesthesia: the lesion was anesthetized in a standard fashion   Anesthetic:  1% lidocaine w/ epinephrine 1-100,000 buffered w/ 8.4% NaHCO3 Curettage performed  in three different directions: Yes   Electrodesiccation performed over the curetted area: Yes   Curettage cycles:  3 Final wound size (cm):  1.3 Hemostasis achieved with:  electrodesiccation Outcome: patient tolerated procedure well with no complications   Post-procedure details: sterile dressing applied and wound care instructions given   Dressing type: petrolatum    Specimen 1 - Surgical pathology Differential Diagnosis: R/O SCC  Check Margins: No  ED&C has about an 85% cure rate and leaves a round wound the size of the skin cancer which is healed with ointment and a bandage over a few weeks time. It leaves a round white scar. No additional pathology is done. If the skin cancer were to come back, we would need to do a surgery to remove it.   Excision involves cutting out the spot with an area of normal looking skin around it followed by closing it with stitches. The cure rate is approximately 92-93%. It leaves a line scar, and you must take it easy for two weeks after surgery (no lifting over 10-15 lbs, avoid activity to get your heart rate and blood pressure up). There is a slightly higher risk of infection, bleeding or the wound opening up with this compared to the scrape and burn.   Pt prefers ED&C today   Return in about 3 months (around 07/06/2022) for TBSE, Biopsy Follow Up.  I, Emelia Salisbury, CMA, am acting as scribe for Forest Gleason, MD.  Documentation: I have reviewed the above documentation for accuracy and completeness, and I agree with the above.  Forest Gleason, MD

## 2022-04-13 ENCOUNTER — Telehealth: Payer: Self-pay

## 2022-04-13 NOTE — Telephone Encounter (Signed)
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 Thank you!

## 2022-04-13 NOTE — Telephone Encounter (Signed)
Discussed pathology results. Patient voiced understanding.  

## 2022-04-16 ENCOUNTER — Encounter: Payer: Self-pay | Admitting: Dermatology

## 2022-05-11 IMAGING — MG MM BREAST LOCALIZATION CLIP
4 series · 4 of 12 positions shown · non-contrast
Comparison: Previous exam(s).

CLINICAL DATA: Post biopsy mammogram of the right breast for clip
placement.

EXAM:
DIAGNOSTIC RIGHT MAMMOGRAM POST STEREOTACTIC BIOPSY

[R CC synth-2D]
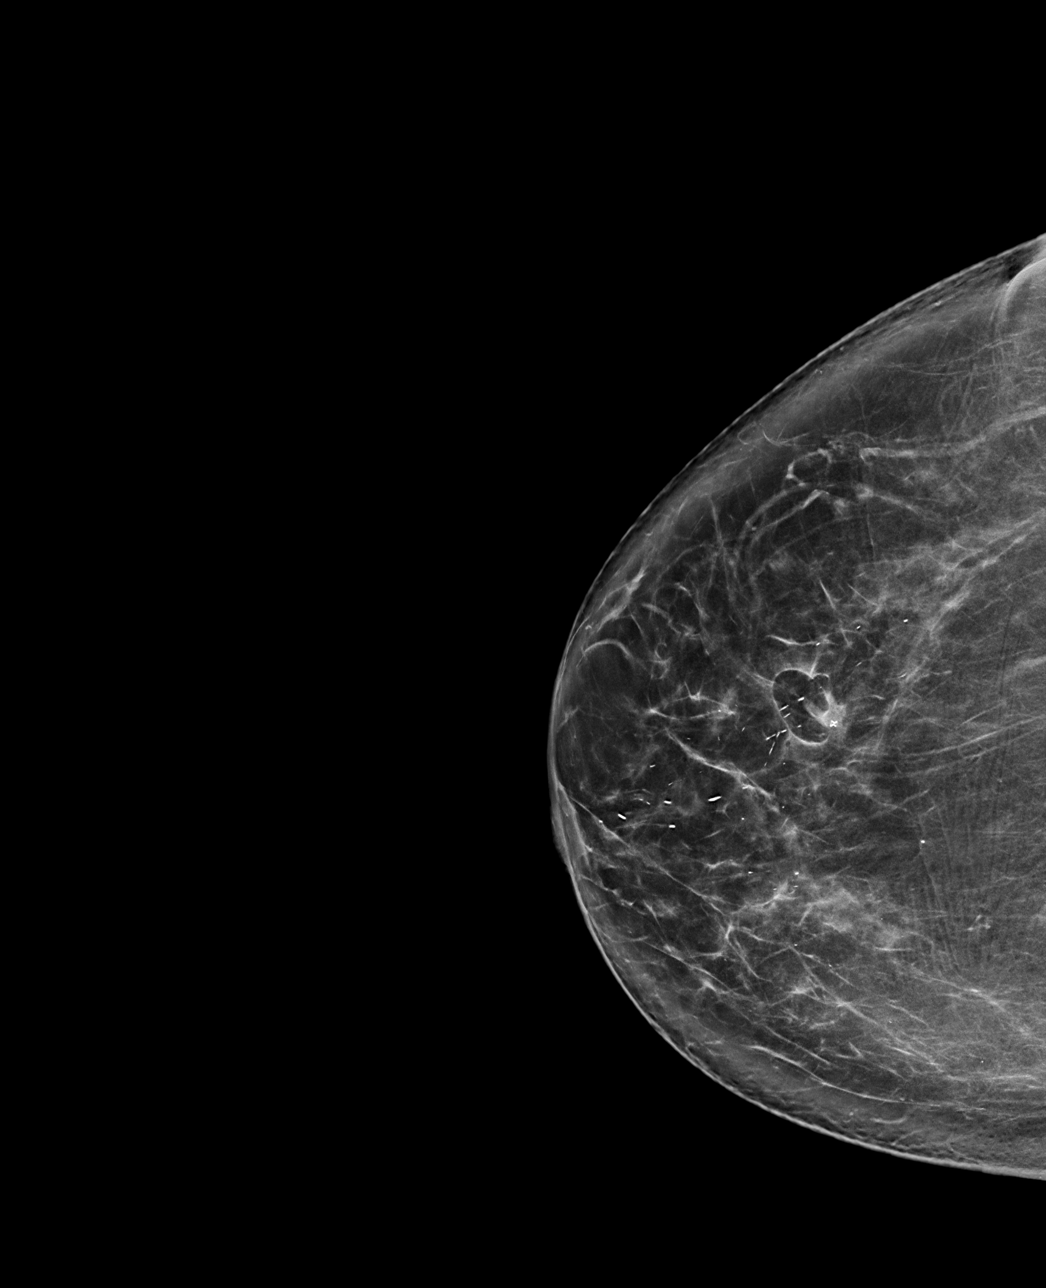

[R ML synth-2D]
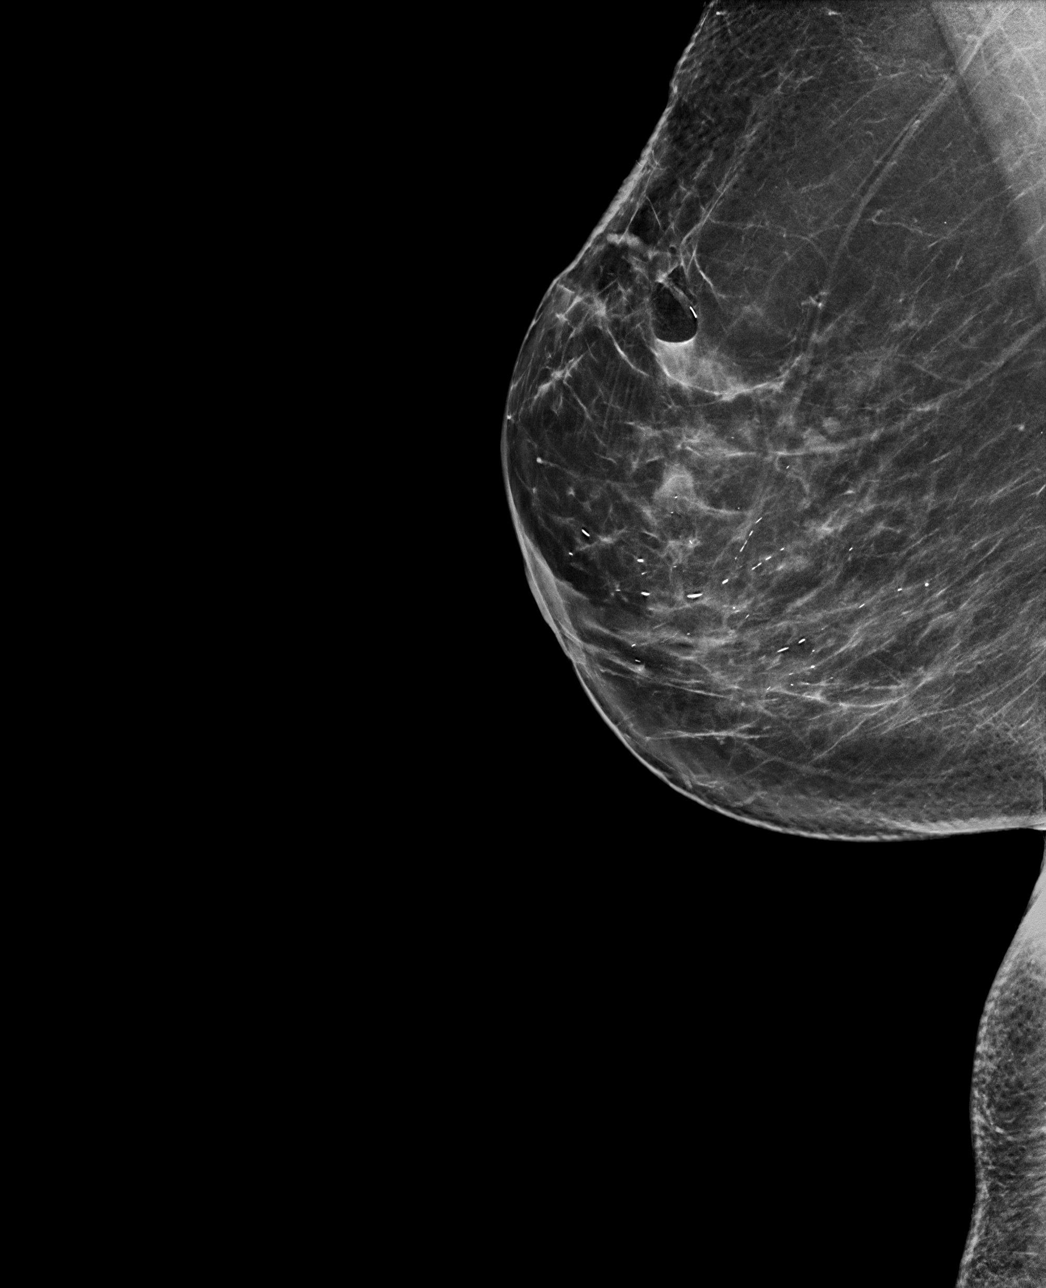

[R ML tomo · tomo slice 45/89.0]
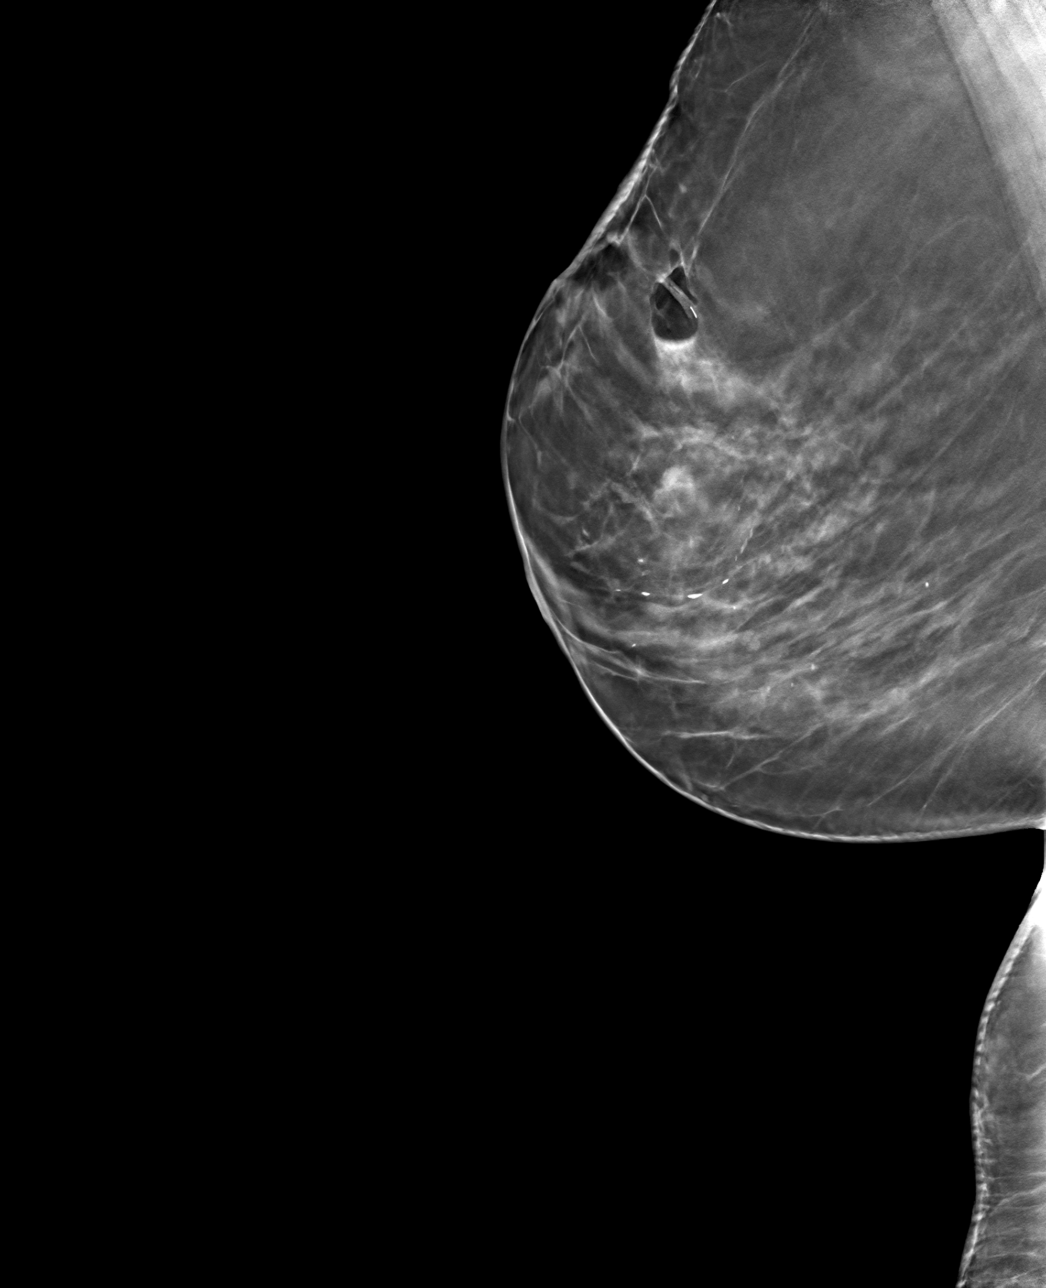

[R CC tomo · tomo slice 44/87.0]
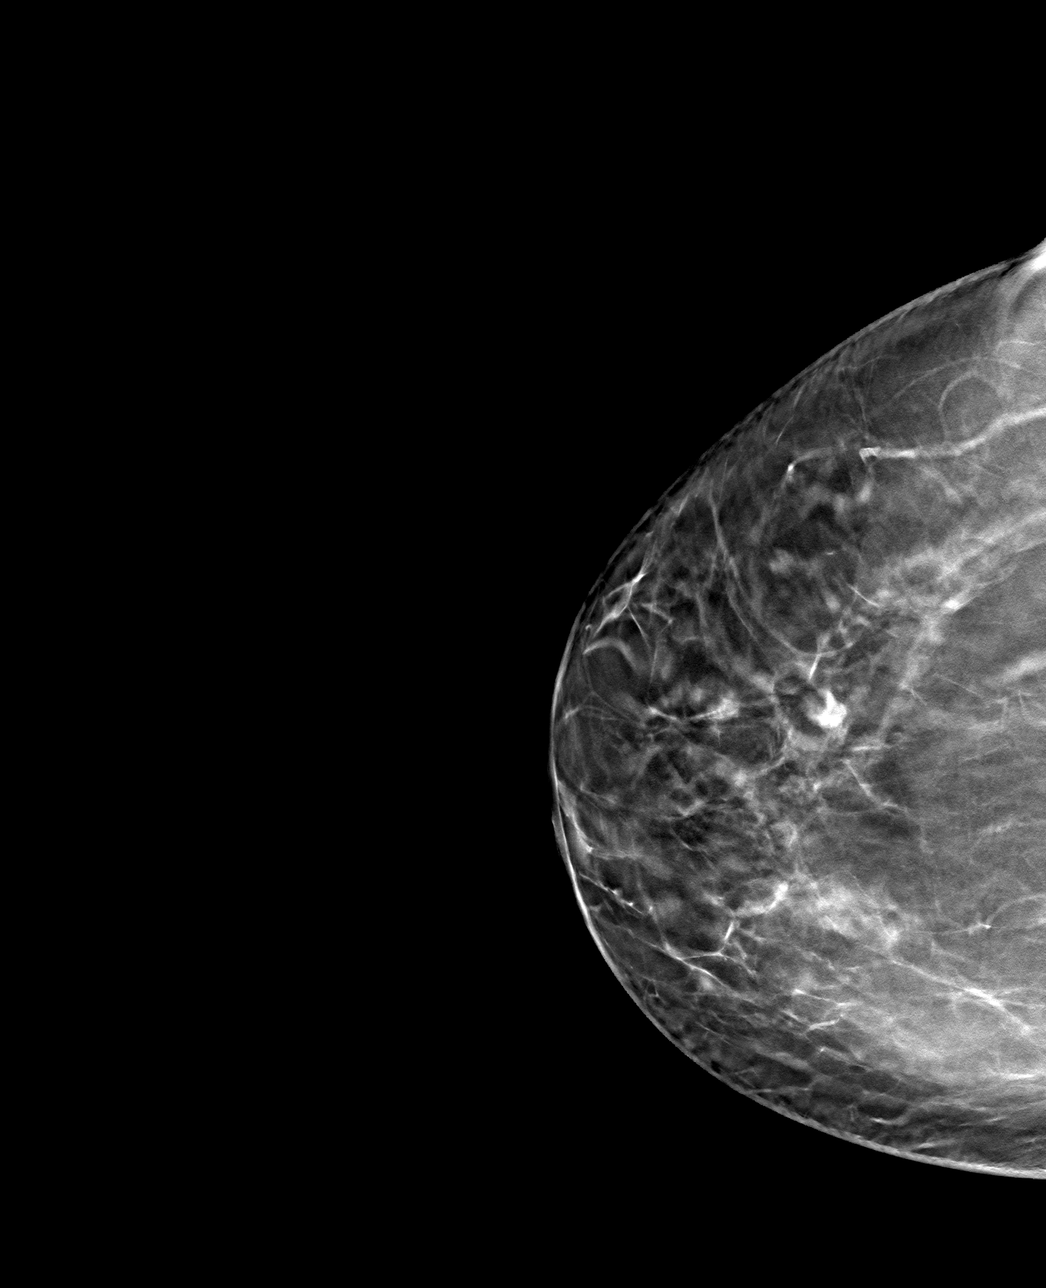

[4 of 12 positions shown; findings below may reference images not displayed]

FINDINGS: Mammographic images were obtained following stereotactic guided
biopsy of possible distortion in the upper-outer right breast. The
biopsy marking clip is in expected position at the site of biopsy.
IMPRESSION: Appropriate positioning of the X shaped biopsy marking clip at the
site of biopsy in the upper-outer right breast.

Final Assessment: Post Procedure Mammograms for Marker Placement

## 2022-07-07 ENCOUNTER — Ambulatory Visit (INDEPENDENT_AMBULATORY_CARE_PROVIDER_SITE_OTHER): Payer: Medicare Other | Admitting: Dermatology

## 2022-07-07 VITALS — BP 133/64 | HR 72

## 2022-07-07 DIAGNOSIS — D2272 Melanocytic nevi of left lower limb, including hip: Secondary | ICD-10-CM | POA: Diagnosis not present

## 2022-07-07 DIAGNOSIS — Z85828 Personal history of other malignant neoplasm of skin: Secondary | ICD-10-CM

## 2022-07-07 DIAGNOSIS — C4491 Basal cell carcinoma of skin, unspecified: Secondary | ICD-10-CM

## 2022-07-07 DIAGNOSIS — D239 Other benign neoplasm of skin, unspecified: Secondary | ICD-10-CM

## 2022-07-07 DIAGNOSIS — L821 Other seborrheic keratosis: Secondary | ICD-10-CM

## 2022-07-07 DIAGNOSIS — L304 Erythema intertrigo: Secondary | ICD-10-CM

## 2022-07-07 DIAGNOSIS — C4441 Basal cell carcinoma of skin of scalp and neck: Secondary | ICD-10-CM

## 2022-07-07 DIAGNOSIS — L570 Actinic keratosis: Secondary | ICD-10-CM | POA: Diagnosis not present

## 2022-07-07 DIAGNOSIS — L719 Rosacea, unspecified: Secondary | ICD-10-CM | POA: Diagnosis not present

## 2022-07-07 DIAGNOSIS — D229 Melanocytic nevi, unspecified: Secondary | ICD-10-CM | POA: Diagnosis not present

## 2022-07-07 DIAGNOSIS — L814 Other melanin hyperpigmentation: Secondary | ICD-10-CM

## 2022-07-07 DIAGNOSIS — Z1283 Encounter for screening for malignant neoplasm of skin: Secondary | ICD-10-CM

## 2022-07-07 DIAGNOSIS — D485 Neoplasm of uncertain behavior of skin: Secondary | ICD-10-CM

## 2022-07-07 HISTORY — DX: Basal cell carcinoma of skin, unspecified: C44.91

## 2022-07-07 HISTORY — DX: Other benign neoplasm of skin, unspecified: D23.9

## 2022-07-07 NOTE — Patient Instructions (Addendum)
Recommend taking Heliocare sun protection supplement daily in sunny weather for additional sun protection. For maximum protection on the sunniest days, you can take up to 2 capsules of regular Heliocare OR take 1 capsule of Heliocare Ultra. For prolonged exposure (such as a full day in the sun), you can repeat your dose of the supplement 4 hours after your first dose. Heliocare can be purchased at [facility], at some [facility] or at www.[email or web address].   Wound Care Instructions  Cleanse wound gently with soap and water once a day then pat dry with clean gauze. Apply a thin coat of Petrolatum (petroleum jelly, "Vaseline") over the wound (unless you have an allergy to this). We recommend that you use a new, sterile tube of Vaseline. Do not pick or remove scabs. Do not remove the yellow or white "healing tissue" from the base of the wound.  Cover the wound with fresh, clean, nonstick gauze and secure with paper tape. You may use Band-Aids in place of gauze and tape if the wound is small enough, but would recommend trimming much of the tape off as there is often too much. Sometimes Band-Aids can irritate the skin.  You should call the office for your biopsy report after 1 week if you have not already been contacted.  If you experience any problems, such as abnormal amounts of bleeding, swelling, significant bruising, significant pain, or evidence of infection, please call the office immediately.  FOR ADULT SURGERY PATIENTS: If you need something for pain relief you may take 1 extra strength Tylenol (acetaminophen) AND 2 Ibuprofen (200mg each) together every 4 hours as needed for pain. (do not take these if you are allergic to them or if you have a reason you should not take them.) Typically, you may only need pain medication for 1 to 3 days.     Due to recent changes in healthcare laws, you may see results of your pathology and/or laboratory studies on MyChart before the doctors have had a  chance to review them. We understand that in some cases there may be results that are confusing or concerning to you. Please understand that not all results are received at the same time and often the doctors may need to interpret multiple results in order to provide you with the best plan of care or course of treatment. Therefore, we ask that you please give us 2 business days to thoroughly review all your results before contacting the office for clarification. Should we see a critical lab result, you will be contacted sooner.   If You Need Anything After Your Visit  If you have any questions or concerns for your doctor, please call our main line at [phone] and press option 4 to reach your doctor's medical assistant. If no one answers, please leave a voicemail as directed and we will return your call as soon as possible. Messages left after 4 pm will be answered the following business day.   You may also send us a message via MyChart. We typically respond to MyChart messages within 1-2 business days.  For prescription refills, please ask your pharmacy to contact our office. Our fax number is [phone].  If you have an urgent issue when the clinic is closed that cannot wait until the next business day, you can page your doctor at the number below.    Please note that while we do our best to be available for urgent issues outside of office hours, we are not available 24/7.     If you have an urgent issue and are unable to reach us, you may choose to seek medical care at your doctor's office, retail clinic, urgent care center, or emergency room.  If you have a medical emergency, please immediately call 911 or go to the emergency department.  Pager Numbers  - Dr. Kowalski: [phone]  - Dr. Moye: [phone]  - Dr. Stewart: [phone]  In the event of inclement weather, please call our main line at [phone] for an update on the status of any delays or closures.  Dermatology  Medication Tips: Please keep the boxes that topical medications come in in order to help keep track of the instructions about where and how to use these. Pharmacies typically print the medication instructions only on the boxes and not directly on the medication tubes.   If your medication is too expensive, please contact our office at [phone] option 4 or send us a message through MyChart.   We are unable to tell what your co-pay for medications will be in advance as this is different depending on your insurance coverage. However, we may be able to find a substitute medication at lower cost or fill out paperwork to get insurance to cover a needed medication.   If a prior authorization is required to get your medication covered by your insurance company, please allow us 1-2 business days to complete this process.  Drug prices often vary depending on where the prescription is filled and some pharmacies may offer cheaper prices.  The website www.goodrx.com contains coupons for medications through different pharmacies. The prices here do not account for what the cost may be with help from insurance (it may be cheaper with your insurance), but the website can give you the price if you did not use any insurance.  - You can print the associated coupon and take it with your prescription to the pharmacy.  - You may also stop by our office during regular business hours and pick up a GoodRx coupon card.  - If you need your prescription sent electronically to a different pharmacy, notify our office through  MyChart or by phone at [phone] option 4.     Si Usted Necesita Algo Despus de Su Visita  Tambin puede enviarnos un mensaje a travs de MyChart. Por lo general respondemos a los mensajes de MyChart en el transcurso de 1 a 2 das hbiles.  Para renovar recetas, por favor pida a su farmacia que se ponga en contacto con nuestra oficina. Nuestro nmero de fax es el [phone].  Si  tiene un asunto urgente cuando la clnica est cerrada y que no puede esperar hasta el siguiente da hbil, puede llamar/localizar a su doctor(a) al nmero que aparece a continuacin.   Por favor, tenga en cuenta que aunque hacemos todo lo posible para estar disponibles para asuntos urgentes fuera del horario de oficina, no estamos disponibles las 24 horas del da, los 7 das de la semana.   Si tiene un problema urgente y no puede comunicarse con nosotros, puede optar por buscar atencin mdica  en el consultorio de su doctor(a), en una clnica privada, en un centro de atencin urgente o en una sala de emergencias.  Si tiene una emergencia mdica, por favor llame inmediatamente al 911 o vaya a la sala de emergencias.  Nmeros de bper  - Dr. Kowalski: [phone]  - Dra. Moye: [phone]  - Dra. Stewart: [phone]  En caso de inclemencias del tiempo, por favor llame a nuestra lnea principal   al [phone] para una actualizacin sobre el estado de cualquier retraso o cierre.  Consejos para la medicacin en dermatologa: Por favor, guarde las cajas en las que vienen los medicamentos de uso tpico para ayudarle a seguir las instrucciones sobre dnde y cmo usarlos. Las farmacias generalmente imprimen las instrucciones del medicamento slo en las cajas y no directamente en los tubos del medicamento.   Si su medicamento es muy caro, por favor, pngase en contacto con nuestra oficina llamando al [phone] y presione la opcin 4 o envenos un mensaje a travs de MyChart.   No podemos decirle cul ser su copago por los medicamentos por adelantado ya que esto es diferente dependiendo de la cobertura de su seguro. Sin embargo, es posible que podamos encontrar un medicamento sustituto a menor costo o llenar un formulario para que el seguro cubra el medicamento que se considera necesario.   Si se requiere una autorizacin previa para que su compaa de seguros cubra su medicamento, por favor  permtanos de 1 a 2 das hbiles para completar este proceso.  Los precios de los medicamentos varan con frecuencia dependiendo del lugar de dnde se surte la receta y alguna farmacias pueden ofrecer precios ms baratos.  El sitio web www.goodrx.com tiene cupones para medicamentos de diferentes farmacias. Los precios aqu no tienen en cuenta lo que podra costar con la ayuda del seguro (puede ser ms barato con su seguro), pero el sitio web puede darle el precio si no utiliz ningn seguro.  - Puede imprimir el cupn correspondiente y llevarlo con su receta a la farmacia.  - Tambin puede pasar por nuestra oficina durante el horario de atencin regular y recoger una tarjeta de cupones de GoodRx.  - Si necesita que su receta se enve electrnicamente a una farmacia diferente, informe a nuestra oficina a travs de MyChart de [facility] o por telfono llamando al [phone] y presione la opcin 4.  

## 2022-07-07 NOTE — Progress Notes (Signed)
Follow-Up Visit   Subjective  Isabel Johnson is a 72 y.o. female who presents for the following: Skin Cancer Screening and Full Body Skin Exam  The patient presents for Total-Body Skin Exam (TBSE) for skin cancer screening and mole check. The patient has spots, moles and lesions to be evaluated, some may be new or changing and the patient has concerns that these could be cancer.    The following portions of the chart were reviewed this encounter and updated as appropriate: medications, allergies, medical history  Review of Systems:  No other skin or systemic complaints except as noted in HPI or Assessment and Plan.  Objective  Well appearing patient in no apparent distress; mood and affect are within normal limits.  A full examination was performed including scalp, head, eyes, ears, nose, lips, neck, chest, axillae, abdomen, back, buttocks, bilateral upper extremities, bilateral lower extremities, hands, feet, fingers, toes, fingernails, and toenails. All findings within normal limits unless otherwise noted below.   Relevant physical exam findings are noted in the Assessment and Plan.  L med knee 0.3 cm medium to dark brown thin papule.       Left upper back at base of neck 0.6 cm pink papule.       Assessment & Plan   LENTIGINES, SEBORRHEIC KERATOSES, HEMANGIOMAS - Benign normal skin lesions - Benign-appearing - Call for any changes  MELANOCYTIC NEVI - Tan-brown and/or pink-flesh-colored symmetric macules and papules - Benign appearing on exam today - Observation - Call clinic for new or changing moles - Recommend daily use of broad spectrum spf 30+ sunscreen to sun-exposed areas.   ACTINIC DAMAGE - Chronic condition, secondary to cumulative UV/sun exposure - diffuse scaly erythematous macules with underlying dyspigmentation - Recommend daily broad spectrum sunscreen SPF 30+ to sun-exposed areas, reapply every 2 hours as needed.  - Staying in the shade or  wearing long sleeves, sun glasses (UVA+UVB protection) and wide brim hats (4-inch brim around the entire circumference of the hat) are also recommended for sun protection.  - Call for new or changing lesions.  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  - Call if any new or changing lesions are noted between office visits  ROSACEA Exam Mid face erythema with telangiectasias +/- scattered inflammatory papules  Chronic and persistent condition with duration or expected duration over one year. Condition is bothersome/symptomatic for patient. Currently flared.  Rosacea is a chronic progressive skin condition usually affecting the face of adults, causing redness and/or acne bumps. It is treatable but not curable. It sometimes affects the eyes (ocular rosacea) as well. It may respond to topical and/or systemic medication and can flare with stress, sun exposure, alcohol, exercise, topical steroids (including hydrocortisone/cortisone 10) and some foods.  Daily application of broad spectrum spf 30+ sunscreen to face is recommended to reduce flares.  Patient denies grittiness of the eyes apart from the past couple of days (allergy related). Recommend ketotifen ophthalmic (eye) solution which is used to temporarily prevent itching of the eye caused by a condition known as allergic conjunctivitis. It works by acting on certain cells, called mast cells, to prevent them from releasing substances that cause the allergic reaction.  Treatment Plan  Patient defers treatment, states not bothersome to her.  ACTINIC KERATOSIS Exam: Erythematous thin papules/macules with gritty scale  Actinic keratoses are precancerous spots that appear secondary to cumulative UV  radiation exposure/sun exposure over time. They are chronic with expected duration over 1 year. A portion of actinic keratoses will progress to squamous cell carcinoma of the skin. It is not possible to reliably predict which spots will progress to skin cancer and so treatment is recommended to prevent development of skin cancer.  Recommend daily broad spectrum sunscreen SPF 30+ to sun-exposed areas, reapply every 2 hours as needed.  Recommend staying in the shade or wearing long sleeves, sun glasses (UVA+UVB protection) and wide brim hats (4-inch brim around the entire circumference of the hat). Call for new or changing lesions.  Treatment Plan:  Prior to procedure, discussed risks of blister formation, small wound, skin dyspigmentation, or rare scar following cryotherapy. Recommend Vaseline ointment to treated areas while healing.  Destruction Procedure Note Destruction method: cryotherapy   Informed consent: discussed and consent obtained   Lesion destroyed using liquid nitrogen: Yes   Outcome: patient tolerated procedure well with no complications   Post-procedure details: wound care instructions given   Locations: L pretibial x 1 # of Lesions Treated: 1   INTERTRIGO Exam Erythematous macerated patches  Chronic and persistent condition with duration or expected duration over one year. Condition is bothersome/symptomatic for patient. Currently flared.  Intertrigo is a chronic recurrent rash that occurs in skin fold areas that may be associated with friction; heat; moisture; yeast; fungus; and bacteria.  It is exacerbated by increased movement / activity; sweating; and higher atmospheric temperature.  Treatment Plan  Start Zeasorb AF powder or other OTC antifungal powder to the area daily to prevent rash recurrence. Other options to help keep the area dry include blow drying the area after bathing or using antiperspirant products such as Duradry to help keep the area dry.  Neoplasm  of uncertain behavior of skin (2) L med knee  Epidermal / dermal shaving  Lesion diameter (cm):  0.3 Informed consent: discussed and consent obtained   Timeout: patient name, date of birth, surgical site, and procedure verified   Procedure prep:  Patient was prepped and draped in usual sterile fashion Prep type:  Isopropyl alcohol Anesthesia: the lesion was anesthetized in a standard fashion   Anesthetic:  1% lidocaine w/ epinephrine 1-100,000 buffered w/ 8.4% NaHCO3 Instrument used: flexible razor blade   Hemostasis achieved with: pressure, aluminum chloride and electrodesiccation   Outcome: patient tolerated procedure well   Post-procedure details: sterile dressing applied and wound care instructions given   Dressing type: bandage and petrolatum    Specimen 1 - Surgical pathology Differential Diagnosis: D48.5 r/o dysplastic nevus Check Margins: No  Left upper back at base of neck  Skin / nail biopsy Type of biopsy: tangential   Informed consent: discussed and consent obtained   Timeout: patient name, date of birth, surgical site, and procedure verified   Procedure prep:  Patient was prepped and draped in usual sterile fashion Prep type:  Isopropyl alcohol Anesthesia: the lesion was anesthetized in a standard fashion   Anesthetic:  1% lidocaine w/ epinephrine 1-100,000 buffered w/ 8.4% NaHCO3 Instrument used: flexible razor blade   Hemostasis achieved with: pressure, aluminum chloride and electrodesiccation   Outcome: patient tolerated procedure well   Post-procedure details: sterile dressing applied and wound care instructions given   Dressing type: bandage and petrolatum    Specimen 2 - Surgical pathology Differential Diagnosis: D48.5 r/o BCC Check Margins: No  SKIN CANCER SCREENING PERFORMED TODAY.  Return in about 6 months (around 01/06/2023) for TBSE.  Documentation: I have reviewed the above documentation for accuracy and completeness, and I agree with the  above.  Darden Dates, MD

## 2022-07-14 ENCOUNTER — Telehealth: Payer: Self-pay

## 2022-07-14 NOTE — Telephone Encounter (Signed)
-----   Message from Sandi Mealy, MD sent at 07/13/2022  2:39 PM EDT ----- 1. 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 On the spectrum from normal mole to melanoma skin cancer, this is in between the two but closer towards a melanoma skin cancer.  - The treatment of choice for severely atypical moles is to cut them out in clinic with an area of normal looking skin around them to get all the atypical cells out. The skin that is removed will be sent to check under the microscope again to be sure it looks completely out.   - People who have a history of atypical moles do have a slightly increased risk of developing melanoma somewhere on the body, so a full body skin exam by a dermatologist is recommended at least once a year. - Monthly self skin checks and daily sun protection are also recommended.  - Please also call if you notice any new or changing spots anywhere else on the body before your follow-up visit.   2. Skin , left upper back at base of neck SUPERFICIAL AND NODULAR BASAL CELL CARCINOMA --> ED&C vs excision  ED&C has about an 85% cure rate and leaves a round wound the size of the skin cancer which is healed with ointment and a bandage over a few weeks time. It leaves a round white scar. No additional pathology is done. If the skin cancer were to come back, we would need to do a surgery to remove it.   Excision involves cutting out the spot with an area of normal looking skin around it followed by closing it with stitches. The cure rate is approximately 92-93%. It leaves a line scar, and you must take it easy for two weeks after surgery (no lifting over 10-15 lbs, avoid activity to get your heart rate and blood pressure up). There is a slightly higher risk of infection, bleeding or the wound opening up with this compared to the scrape and  burn.   MAs please call with results and schedule. Please let me know if they have questions for me.   Thank you!

## 2022-07-14 NOTE — Telephone Encounter (Signed)
Discuss pathology results with patient. 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

## 2022-07-21 ENCOUNTER — Ambulatory Visit (INDEPENDENT_AMBULATORY_CARE_PROVIDER_SITE_OTHER): Payer: Medicare Other | Admitting: Dermatology

## 2022-07-21 ENCOUNTER — Encounter: Payer: Self-pay | Admitting: Dermatology

## 2022-07-21 DIAGNOSIS — D492 Neoplasm of unspecified behavior of bone, soft tissue, and skin: Secondary | ICD-10-CM

## 2022-07-21 DIAGNOSIS — D485 Neoplasm of uncertain behavior of skin: Secondary | ICD-10-CM

## 2022-07-21 MED ORDER — MUPIROCIN 2 % EX OINT: 1.0000 | TOPICAL_OINTMENT | Freq: Every day | CUTANEOUS | 0 refills | Status: DC

## 2022-07-21 NOTE — Patient Instructions (Signed)
Wound Care Instructions for After Surgery  On the day following your surgery, you should begin doing daily dressing changes until your sutures are removed: Remove the bandage. Cleanse the wound gently with soap and water.  Make sure you then dry the skin surrounding the wound completely or the tape will not stick to the skin. Do not use cotton balls on the wound. After the wound is clean and dry, apply the ointment (either prescription antibiotic prescribed by your doctor or plain Vaseline if nothing was prescribed) gently with a Q-tip. If you are using a bandaid to cover: Apply a bandaid large enough to cover the entire wound. If you do not have a bandaid large enough to cover the wound OR if you are sensitive to bandaid adhesive: Cut a non-stick pad (such as Telfa) to fit the size of the wound.  Cover the wound with the non-stick pad. If the wound is draining, you may want to add a small amount of gauze on top of the non-stick pad for a little added compression to the area. Use tape to seal the area completely.  For the next 1-2 weeks: Be sure to keep the wound moist with ointment 24/7 to ensure best healing. If you are unable to cover the wound with a bandage to hold the ointment in place, you may need to reapply the ointment several times a day. Do not bend over or lift heavy items to reduce the chance of elevated blood pressure to the wound. Do not participate in particularly strenuous activities.  Below is a list of dressing supplies you might need.  Cotton-tipped applicators - Q-tips Gauze pads (2x2 and/or 4x4) - All-Purpose Sponges New and clean tube of petroleum jelly (Vaseline) OR prescription antibiotic ointment if prescribed Either a bandaid large enough to cover the entire wound OR non-stick dressing material (Telfa) and Tape (Paper or Hypafix)  FOR ADULT SURGERY PATIENTS: If you need something for pain relief, you may take 1 extra strength Tylenol (acetaminophen) and 2  ibuprofen (200 mg) together every 4 hours as needed. (Do not take these medications if you are allergic to them or if you know you cannot take them for any other reason). Typically you may only need pain medication for 1-3 days.   Comments on the Post-Operative Period Slight swelling and redness often appear around the wound. This is normal and will disappear within several days following the surgery. The healing wound will drain a brownish-red-yellow discharge during healing. This is a normal phase of wound healing. As the wound begins to heal, the drainage may increase in amount. Again, this drainage is normal. Notify us if the drainage becomes persistently bloody, excessively swollen, or intensely painful or develops a foul odor or red streaks.  The healing wound will also typically be itchy. This is normal. If you have severe or persistent pain, Notify us if the discomfort is severe or persistent. Avoid alcoholic beverages when taking pain medicine.  In Case of Wound Hemorrhage A wound hemorrhage is when the bandage suddenly becomes soaked with bright red blood and flows profusely. If this happens, sit down or lie down with your head elevated. If the wound has a dressing on it, do not remove the dressing. Apply pressure to the existing gauze. If the wound is not covered, use a gauze pad to apply pressure and continue applying the pressure for 20 minutes without peeking. DO NOT COVER THE WOUND WITH A LARGE TOWEL OR WASH CLOTH. Release your hand from the   wound site but do not remove the dressing. If the bleeding has stopped, gently clean around the wound. Leave the dressing in place for 24 hours if possible. This wait time allows the blood vessels to close off so that you do not spark a new round of bleeding by disrupting the newly clotted blood vessels with an immediate dressing change. If the bleeding does not subside, continue to hold pressure for 40 minutes. If bleeding continues, page your  physician, contact an After Hours clinic or go to the Emergency Room.  Due to recent changes in healthcare laws, you may see results of your pathology and/or laboratory studies on MyChart before the doctors have had a chance to review them. We understand that in some cases there may be results that are confusing or concerning to you. Please understand that not all results are received at the same time and often the doctors may need to interpret multiple results in order to provide you with the best plan of care or course of treatment. Therefore, we ask that you please give us 2 business days to thoroughly review all your results before contacting the office for clarification. Should we see a critical lab result, you will be contacted sooner.   If You Need Anything After Your Visit  If you have any questions or concerns for your doctor, please call our main line at [phone] and press option 4 to reach your doctor's medical assistant. If no one answers, please leave a voicemail as directed and we will return your call as soon as possible. Messages left after 4 pm will be answered the following business day.   You may also send us a message via MyChart. We typically respond to MyChart messages within 1-2 business days.  For prescription refills, please ask your pharmacy to contact our office. Our fax number is [phone].  If you have an urgent issue when the clinic is closed that cannot wait until the next business day, you can page your doctor at the number below.    Please note that while we do our best to be available for urgent issues outside of office hours, we are not available 24/7.   If you have an urgent issue and are unable to reach us, you may choose to seek medical care at your doctor's office, retail clinic, urgent care center, or emergency room.  If you have a medical emergency, please immediately call 911 or go to the emergency department.  Pager Numbers  - Dr. Kowalski:  [phone]  - Dr. Moye: [phone]  - Dr. Stewart: [phone]  In the event of inclement weather, please call our main line at [phone] for an update on the status of any delays or closures.  Dermatology Medication Tips: Please keep the boxes that topical medications come in in order to help keep track of the instructions about where and how to use these. Pharmacies typically print the medication instructions only on the boxes and not directly on the medication tubes.   If your medication is too expensive, please contact our office at [phone] option 4 or send us a message through MyChart.   We are unable to tell what your co-pay for medications will be in advance as this is different depending on your insurance coverage. However, we may be able to find a substitute medication at lower cost or fill out paperwork to get insurance to cover a needed medication.   If a prior authorization is required to get your medication covered by   your insurance company, please allow [place] 1-2 business days to complete this process.  Drug prices often vary depending on where the prescription is filled and some pharmacies may offer cheaper prices.  The website www.goodrx.com contains coupons for medications through different pharmacies. The prices here do not account for what the cost may be with help from insurance (it may be cheaper with your insurance), but the website can give you the price if you did not use any insurance.  - You can print the associated coupon and take it with your prescription to the pharmacy.  - You may also stop by our office during regular business hours and pick up a GoodRx coupon card.  - If you need your prescription sent electronically to a different pharmacy, notify our office through [facility] or by phone at [phone] option 4.

## 2022-07-21 NOTE — Progress Notes (Signed)
   Follow-Up Visit   Subjective  Isabel Johnson is a 72 y.o. 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  The following portions of the chart were reviewed this encounter and updated as appropriate: medications, allergies, medical history  Review of Systems:  No other skin or systemic complaints except as noted in HPI or Assessment and Plan.  Objective  Well appearing patient in no apparent distress; mood and affect are within normal limits.  A focused examination was performed of the following areas: Left leg Relevant physical exam findings are noted in the Assessment and Plan.   left medial knee Pink biopsy site    Assessment & Plan   Neoplasm of skin left medial knee  Skin excision  Lesion length (cm):  0.7 Lesion width (cm):  0.7 Margin per side (cm):  0.3 Total excision diameter (cm):  1.3 Informed consent: discussed and consent obtained   Timeout: patient name, date of birth, surgical site, and procedure verified   Procedure prep:  Patient was prepped and draped in usual sterile fashion Prep type:  Chlorhexidine Anesthesia: the lesion was anesthetized in a standard fashion   Anesthetic:  1% lidocaine w/ epinephrine 1-100,000 buffered w/ 8.4% NaHCO3 (6 cc lido w/epi, 6 cc bupivicaine) Instrument used: #15 blade   Hemostasis achieved with: pressure and electrodesiccation    Skin repair Complexity:  Complex Final length (cm):  4.4 Informed consent: discussed and consent obtained   Timeout: patient name, date of birth, surgical site, and procedure verified   Procedure prep:  Patient was prepped and draped in usual sterile fashion Prep type:  Chlorhexidine Anesthesia: the lesion was anesthetized in a standard fashion   Anesthetic:  1% lidocaine w/ epinephrine 1-100,000 local infiltration Reason for type of repair: reduce tension to allow closure, reduce the  risk of dehiscence, infection, and necrosis, allow closure of the large defect, allow side-to-side closure without requiring a flap or graft and enhance both functionality and cosmetic results   Undermining: area extensively undermined   Subcutaneous layers (deep stitches):  Suture size:  2-0 and 3-0 Suture type: Vicryl (polyglactin 910)   Fine/surface layer approximation (top stitches):  Suture size:  4-0 Suture type: Prolene (polypropylene)   Suture removal (days):  7 Hemostasis achieved with: suture, pressure and electrodesiccation Outcome: patient tolerated procedure well with no complications   Post-procedure details: wound care instructions given   Additional details:  Extensive undermining greater than the maximum width of the defect along at least one entire edge of the defect was performed Maximum width of defect perpendicular to line of the closure 1.3 cm Width of undermining done 2.0 cm  Mupirocin and a pressure dressing applied  Specimen 1 - Surgical pathology Differential Diagnosis: BX proven 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  Anise Salvo, RMA, am acting as scribe for Darden Dates, MD .   Documentation: I have reviewed the above documentation for accuracy and completeness, and I agree with the above.  Darden Dates, MD

## 2022-07-28 ENCOUNTER — Ambulatory Visit (INDEPENDENT_AMBULATORY_CARE_PROVIDER_SITE_OTHER): Payer: Medicare Other | Admitting: Dermatology

## 2022-07-28 VITALS — BP 138/60 | HR 71

## 2022-07-28 DIAGNOSIS — Z4802 Encounter for removal of sutures: Secondary | ICD-10-CM

## 2022-07-28 NOTE — Progress Notes (Signed)
   Follow-Up Visit   Subjective  Isabel Johnson is a 72 y.o. female who presents for the following: Suture removal, L med knee  Pathology showed no residual dysplastic nevus, margins free.   The following portions of the chart were reviewed this encounter and updated as appropriate: medications, allergies, medical history  Review of Systems:  No other skin or systemic complaints except as noted in HPI or Assessment and Plan.  Objective  Well appearing patient in no apparent distress; mood and affect are within normal limits.  Areas Examined: The face and legs Relevant physical exam findings are noted in the Assessment and Plan.    Assessment & Plan    Encounter for Removal of Sutures - Incision site is clean, dry and intact. - Wound cleansed, sutures removed, wound cleansed and steri strips applied.  - Discussed pathology results showing no residual dysplastic nevus, margins free.  - Patient advised to keep steri-strips dry until they fall off. - Scars remodel for a full year. - Once steri-strips fall off, patient can apply over-the-counter silicone scar cream once to twice a day to help with scar remodeling if desired. - Patient advised to call with any concerns or if they notice any new or changing lesions.  Return for appointment as scheduled.  Maylene Roes, CMA, am acting as scribe for Darden Dates, MD .   Documentation: I have reviewed the above documentation for accuracy and completeness, and I agree with the above.  Darden Dates, MD

## 2022-07-28 NOTE — Patient Instructions (Signed)
Due to recent changes in healthcare laws, you may see results of your pathology and/or laboratory studies on MyChart before the doctors have had a chance to review them. We understand that in some cases there may be results that are confusing or concerning to you. Please understand that not all results are received at the same time and often the doctors may need to interpret multiple results in order to provide you with the best plan of care or course of treatment. Therefore, we ask that you please give us 2 business days to thoroughly review all your results before contacting the office for clarification. Should we see a critical lab result, you will be contacted sooner.   If You Need Anything After Your Visit  If you have any questions or concerns for your doctor, please call our main line at [phone] and press option 4 to reach your doctor's medical assistant. If no one answers, please leave a voicemail as directed and we will return your call as soon as possible. Messages left after 4 pm will be answered the following business day.   You may also send us a message via MyChart. We typically respond to MyChart messages within 1-2 business days.  For prescription refills, please ask your pharmacy to contact our office. Our fax number is [phone].  If you have an urgent issue when the clinic is closed that cannot wait until the next business day, you can page your doctor at the number below.    Please note that while we do our best to be available for urgent issues outside of office hours, we are not available 24/7.   If you have an urgent issue and are unable to reach us, you may choose to seek medical care at your doctor's office, retail clinic, urgent care center, or emergency room.  If you have a medical emergency, please immediately call 911 or go to the emergency department.  Pager Numbers  - Dr. Kowalski: [phone]  - Dr. Moye: [phone]  - Dr. Stewart:  [phone]  In the event of inclement weather, please call our main line at [phone] for an update on the status of any delays or closures.  Dermatology Medication Tips: Please keep the boxes that topical medications come in in order to help keep track of the instructions about where and how to use these. Pharmacies typically print the medication instructions only on the boxes and not directly on the medication tubes.   If your medication is too expensive, please contact our office at [phone] option 4 or send us a message through MyChart.   We are unable to tell what your co-pay for medications will be in advance as this is different depending on your insurance coverage. However, we may be able to find a substitute medication at lower cost or fill out paperwork to get insurance to cover a needed medication.   If a prior authorization is required to get your medication covered by your insurance company, please allow us 1-2 business days to complete this process.  Drug prices often vary depending on where the prescription is filled and some pharmacies may offer cheaper prices.  The website www.goodrx.com contains coupons for medications through different pharmacies. The prices here do not account for what the cost may be with help from insurance (it may be cheaper with your insurance), but the website can give you the price if you did not use any insurance.  - You can print the associated coupon and take it with   your prescription to the pharmacy.  - You may also stop by our office during regular business hours and pick up a GoodRx coupon card.  - If you need your prescription sent electronically to a different pharmacy, notify our office through [facility] MyChart or by phone at [phone] option 4.     Si Usted Necesita Algo Despus de Su Visita  Tambin puede enviarnos un mensaje a travs de MyChart. Por lo general respondemos a los mensajes de MyChart en el transcurso de 1 a 2  das hbiles.  Para renovar recetas, por favor pida a su farmacia que se ponga en contacto con nuestra oficina. Nuestro nmero de fax es el [phone].  Si tiene un asunto urgente cuando la clnica est cerrada y que no puede esperar hasta el siguiente da hbil, puede llamar/localizar a su doctor(a) al nmero que aparece a continuacin.   Por favor, tenga en cuenta que aunque hacemos todo lo posible para estar disponibles para asuntos urgentes fuera del horario de oficina, no estamos disponibles las 24 horas del da, los 7 das de la semana.   Si tiene un problema urgente y no puede comunicarse con nosotros, puede optar por buscar atencin mdica  en el consultorio de su doctor(a), en una clnica privada, en un centro de atencin urgente o en una sala de emergencias.  Si tiene una emergencia mdica, por favor llame inmediatamente al 911 o vaya a la sala de emergencias.  Nmeros de bper  - Dr. Kowalski: [phone]  - Dra. Moye: [phone]  - Dra. Stewart: [phone]  En caso de inclemencias del tiempo, por favor llame a nuestra lnea principal al [phone] para una actualizacin sobre el estado de cualquier retraso o cierre.  Consejos para la medicacin en dermatologa: Por favor, guarde las cajas en las que vienen los medicamentos de uso tpico para ayudarle a seguir las instrucciones sobre dnde y cmo usarlos. Las farmacias generalmente imprimen las instrucciones del medicamento slo en las cajas y no directamente en los tubos del medicamento.   Si su medicamento es muy caro, por favor, pngase en contacto con nuestra oficina llamando al [phone] y presione la opcin 4 o envenos un mensaje a travs de MyChart.   No podemos decirle cul ser su copago por los medicamentos por adelantado ya que esto es diferente dependiendo de la cobertura de su seguro. Sin embargo, es posible que podamos encontrar un medicamento sustituto a menor costo o llenar un formulario para que el  seguro cubra el medicamento que se considera necesario.   Si se requiere una autorizacin previa para que su compaa de seguros cubra su medicamento, por favor permtanos de 1 a 2 das hbiles para completar este proceso.  Los precios de los medicamentos varan con frecuencia dependiendo del lugar de dnde se surte la receta y alguna farmacias pueden ofrecer precios ms baratos.  El sitio web www.goodrx.com tiene cupones para medicamentos de diferentes farmacias. Los precios aqu no tienen en cuenta lo que podra costar con la ayuda del seguro (puede ser ms barato con su seguro), pero el sitio web puede darle el precio si no utiliz ningn seguro.  - Puede imprimir el cupn correspondiente y llevarlo con su receta a la farmacia.  - Tambin puede pasar por nuestra oficina durante el horario de atencin regular y recoger una tarjeta de cupones de GoodRx.  - Si necesita que su receta se enve electrnicamente a una farmacia diferente, informe a nuestra oficina a travs de MyChart de [facility]   o por telfono llamando al [phone] y presione la opcin 4.  

## 2022-08-04 ENCOUNTER — Encounter: Payer: Self-pay | Admitting: Dermatology

## 2022-08-04 ENCOUNTER — Ambulatory Visit (INDEPENDENT_AMBULATORY_CARE_PROVIDER_SITE_OTHER): Payer: Medicare Other | Admitting: Dermatology

## 2022-08-04 DIAGNOSIS — C4491 Basal cell carcinoma of skin, unspecified: Secondary | ICD-10-CM

## 2022-08-04 DIAGNOSIS — C44519 Basal cell carcinoma of skin of other part of trunk: Secondary | ICD-10-CM

## 2022-08-04 NOTE — Patient Instructions (Addendum)
Wound Care Instructions for After Surgery  On the day following your surgery, you should begin doing daily dressing changes until your sutures are removed: Remove the bandage. Cleanse the wound gently with soap and water.  Make sure you then dry the skin surrounding the wound completely or the tape will not stick to the skin. Do not use cotton balls on the wound. After the wound is clean and dry, apply the ointment (either prescription antibiotic prescribed by your doctor or plain Vaseline if nothing was prescribed) gently with a Q-tip. If you are using a bandaid to cover: Apply a bandaid large enough to cover the entire wound. If you do not have a bandaid large enough to cover the wound OR if you are sensitive to bandaid adhesive: Cut a non-stick pad (such as Telfa) to fit the size of the wound.  Cover the wound with the non-stick pad. If the wound is draining, you may want to add a small amount of gauze on top of the non-stick pad for a little added compression to the area. Use tape to seal the area completely.  For the next 1-2 weeks: Be sure to keep the wound moist with ointment 24/7 to ensure best healing. If you are unable to cover the wound with a bandage to hold the ointment in place, you may need to reapply the ointment several times a day. Do not bend over or lift heavy items to reduce the chance of elevated blood pressure to the wound. Do not participate in particularly strenuous activities.  Below is a list of dressing supplies you might need.  Cotton-tipped applicators - Q-tips Gauze pads (2x2 and/or 4x4) - [employer] Sponges New and clean tube of petroleum jelly (Vaseline) OR prescription antibiotic ointment if prescribed Either a bandaid large enough to cover the entire wound OR non-stick dressing material (Telfa) and Tape (Paper or Hypafix)  FOR ADULT SURGERY PATIENTS: If you need something for pain relief, you may take 1 extra strength Tylenol (acetaminophen) and 2  ibuprofen (200 mg) together every 4 hours as needed. (Do not take these medications if you are allergic to them or if you know you cannot take them for any other reason). Typically you may only need pain medication for 1-3 days.   Comments on the Post-Operative Period Slight swelling and redness often appear around the wound. This is normal and will disappear within several days following the surgery. The healing wound will drain a brownish-red-yellow discharge during healing. This is a normal phase of wound healing. As the wound begins to heal, the drainage may increase in amount. Again, this drainage is normal. Notify us if the drainage becomes persistently bloody, excessively swollen, or intensely painful or develops a foul odor or red streaks.  The healing wound will also typically be itchy. This is normal. If you have severe or persistent pain, Notify us if the discomfort is severe or persistent. Avoid alcoholic beverages when taking pain medicine.  In Case of Wound Hemorrhage A wound hemorrhage is when the bandage suddenly becomes soaked with bright red blood and flows profusely. If this happens, sit down or lie down with your head elevated. If the wound has a dressing on it, do not remove the dressing. Apply pressure to the existing gauze. If the wound is not covered, use a gauze pad to apply pressure and continue applying the pressure for 20 minutes without peeking. DO NOT COVER THE WOUND WITH A LARGE TOWEL OR WASH CLOTH. Release your hand from the   wound site but do not remove the dressing. If the bleeding has stopped, gently clean around the wound. Leave the dressing in place for 24 hours if possible. This wait time allows the blood vessels to close off so that you do not spark a new round of bleeding by disrupting the newly clotted blood vessels with an immediate dressing change. If the bleeding does not subside, continue to hold pressure for 40 minutes. If bleeding continues, page your  physician, contact an After Hours clinic or go to the Emergency Room.  Due to recent changes in healthcare laws, you may see results of your pathology and/or laboratory studies on MyChart before the doctors have had a chance to review them. We understand that in some cases there may be results that are confusing or concerning to you. Please understand that not all results are received at the same time and often the doctors may need to interpret multiple results in order to provide you with the best plan of care or course of treatment. Therefore, we ask that you please give us 2 business days to thoroughly review all your results before contacting the office for clarification. Should we see a critical lab result, you will be contacted sooner.   If You Need Anything After Your Visit  If you have any questions or concerns for your doctor, please call our main line at [phone] and press option 4 to reach your doctor's medical assistant. If no one answers, please leave a voicemail as directed and we will return your call as soon as possible. Messages left after 4 pm will be answered the following business day.   You may also send us a message via MyChart. We typically respond to MyChart messages within 1-2 business days.  For prescription refills, please ask your pharmacy to contact our office. Our fax number is [phone].  If you have an urgent issue when the clinic is closed that cannot wait until the next business day, you can page your doctor at the number below.    Please note that while we do our best to be available for urgent issues outside of office hours, we are not available 24/7.   If you have an urgent issue and are unable to reach us, you may choose to seek medical care at your doctor's office, retail clinic, urgent care center, or emergency room.  If you have a medical emergency, please immediately call 911 or go to the emergency department.  Pager Numbers  - Dr. Kowalski:  [phone]  - Dr. Moye: [phone]  - Dr. Stewart: [phone]  In the event of inclement weather, please call our main line at [phone] for an update on the status of any delays or closures.  Dermatology Medication Tips: Please keep the boxes that topical medications come in in order to help keep track of the instructions about where and how to use these. Pharmacies typically print the medication instructions only on the boxes and not directly on the medication tubes.   If your medication is too expensive, please contact our office at [phone] option 4 or send us a message through MyChart.   We are unable to tell what your co-pay for medications will be in advance as this is different depending on your insurance coverage. However, we may be able to find a substitute medication at lower cost or fill out paperwork to get insurance to cover a needed medication.   If a prior authorization is required to get your medication covered by   your insurance company, please allow us 1-2 business days to complete this process.  Drug prices often vary depending on where the prescription is filled and some pharmacies may offer cheaper prices.  The website www.goodrx.com contains coupons for medications through different pharmacies. The prices here do not account for what the cost may be with help from insurance (it may be cheaper with your insurance), but the website can give you the price if you did not use any insurance.  - You can print the associated coupon and take it with your prescription to the pharmacy.  - You may also stop by our office during regular business hours and pick up a GoodRx coupon card.  - If you need your prescription sent electronically to a different pharmacy, notify our office through [facility] MyChart or by phone at [phone] option 4.     

## 2022-08-04 NOTE — Progress Notes (Signed)
   Follow-Up Visit   Subjective  Isabel Johnson is a 72 y.o. female who presents for the following: Excision of bx proven BCC at Left upper back at base of neck   The following portions of the chart were reviewed this encounter and updated as appropriate: medications, allergies, medical history  Review of Systems:  No other skin or systemic complaints except as noted in HPI or Assessment and Plan.  Objective  Well appearing patient in no apparent distress; mood and affect are within normal limits.  A focused examination was performed of the following areas: Back, neck Relevant physical exam findings are noted in the Assessment and Plan.   Left Upper Back at base of neck Pink papule     Assessment & Plan   Basal cell carcinoma (BCC), unspecified site Left Upper Back at base of neck  Skin excision  Lesion length (cm):  0.9 Lesion width (cm):  0.9 Margin per side (cm):  0.4 Total excision diameter (cm):  1.7 Informed consent: discussed and consent obtained   Timeout: patient name, date of birth, surgical site, and procedure verified   Procedure prep:  Patient was prepped and draped in usual sterile fashion Prep type:  Chlorhexidine Anesthesia: the lesion was anesthetized in a standard fashion   Anesthetic:  1% lidocaine w/ epinephrine 1-100,000 buffered w/ 8.4% NaHCO3 Instrument used comment:  15c Hemostasis achieved with: pressure and electrodesiccation    Skin repair Complexity:  Intermediate Final length (cm):  4.1 Informed consent: discussed and consent obtained   Timeout: patient name, date of birth, surgical site, and procedure verified   Procedure prep:  Patient was prepped and draped in usual sterile fashion Prep type:  Chlorhexidine Anesthesia: the lesion was anesthetized in a standard fashion   Anesthetic:  1% lidocaine w/ epinephrine 1-100,000 local infiltration Reason for type of repair: reduce tension to allow closure, reduce the risk of dehiscence,  infection, and necrosis, reduce subcutaneous dead space and avoid a hematoma, preserve normal anatomical and functional relationships and enhance both functionality and cosmetic results   Undermining: edges undermined   Subcutaneous layers (deep stitches):  Suture size:  3-0 Suture type: Vicryl (polyglactin 910)   Fine/surface layer approximation (top stitches):  Suture size:  4-0 Suture type: Prolene (polypropylene)   Suture removal (days):  7 Hemostasis achieved with: suture, pressure and electrodesiccation Outcome: patient tolerated procedure well with no complications   Post-procedure details: wound care instructions given   Additional details:  Mupirocin and a pressure dressing applied  Specimen 1 - Surgical pathology Differential Diagnosis: Bx proven SUPERFICIAL AND NODULAR BASAL CELL CARCINOMA Lat tip tag Check Margins: yes Pink biopsy site [phone]    Return in about 1 week (around 08/11/2022) for Suture Removal.  Anise Salvo, RMA, am acting as scribe for Darden Dates, MD .  Documentation: I have reviewed the above documentation for accuracy and completeness, and I agree with the above.  Darden Dates, MD

## 2022-08-05 ENCOUNTER — Telehealth: Payer: Self-pay

## 2022-08-05 NOTE — Telephone Encounter (Signed)
Called patient to see how she is doing after yesterday's excision. LVM to call office with any concerns. Butch Penny., RMA

## 2022-08-11 ENCOUNTER — Ambulatory Visit (INDEPENDENT_AMBULATORY_CARE_PROVIDER_SITE_OTHER): Payer: Medicare Other | Admitting: Dermatology

## 2022-08-11 ENCOUNTER — Telehealth: Payer: Self-pay

## 2022-08-11 DIAGNOSIS — Z4802 Encounter for removal of sutures: Secondary | ICD-10-CM

## 2022-08-11 NOTE — Telephone Encounter (Signed)
Patient advised pathology showed margins free. Butch Penny., RMA

## 2022-08-11 NOTE — Patient Instructions (Signed)
Due to recent changes in healthcare laws, you may see results of your pathology and/or laboratory studies on MyChart before the doctors have had a chance to review them. We understand that in some cases there may be results that are confusing or concerning to you. Please understand that not all results are received at the same time and often the doctors may need to interpret multiple results in order to provide you with the best plan of care or course of treatment. Therefore, we ask that you please give us 2 business days to thoroughly review all your results before contacting the office for clarification. Should we see a critical lab result, you will be contacted sooner.   If You Need Anything After Your Visit  If you have any questions or concerns for your doctor, please call our main line at [phone] and press option 4 to reach your doctor's medical assistant. If no one answers, please leave a voicemail as directed and we will return your call as soon as possible. Messages left after 4 pm will be answered the following business day.   You may also send us a message via MyChart. We typically respond to MyChart messages within 1-2 business days.  For prescription refills, please ask your pharmacy to contact our office. Our fax number is [phone].  If you have an urgent issue when the clinic is closed that cannot wait until the next business day, you can page your doctor at the number below.    Please note that while we do our best to be available for urgent issues outside of office hours, we are not available 24/7.   If you have an urgent issue and are unable to reach us, you may choose to seek medical care at your doctor's office, retail clinic, urgent care center, or emergency room.  If you have a medical emergency, please immediately call 911 or go to the emergency department.  Pager Numbers  - Dr. Kowalski: [phone]  - Dr. Moye: [phone]  - Dr. Stewart:  [phone]  In the event of inclement weather, please call our main line at [phone] for an update on the status of any delays or closures.  Dermatology Medication Tips: Please keep the boxes that topical medications come in in order to help keep track of the instructions about where and how to use these. Pharmacies typically print the medication instructions only on the boxes and not directly on the medication tubes.   If your medication is too expensive, please contact our office at [phone] option 4 or send us a message through MyChart.   We are unable to tell what your co-pay for medications will be in advance as this is different depending on your insurance coverage. However, we may be able to find a substitute medication at lower cost or fill out paperwork to get insurance to cover a needed medication.   If a prior authorization is required to get your medication covered by your insurance company, please allow us 1-2 business days to complete this process.  Drug prices often vary depending on where the prescription is filled and some pharmacies may offer cheaper prices.  The website www.goodrx.com contains coupons for medications through different pharmacies. The prices here do not account for what the cost may be with help from insurance (it may be cheaper with your insurance), but the website can give you the price if you did not use any insurance.  - You can print the associated coupon and take it with   your prescription to the pharmacy.  - You may also stop by our office during regular business hours and pick up a GoodRx coupon card.  - If you need your prescription sent electronically to a different pharmacy, notify our office through [facility] MyChart or by phone at [phone] option 4.     Si Usted Necesita Algo Despus de Su Visita  Tambin puede enviarnos un mensaje a travs de MyChart. Por lo general respondemos a los mensajes de MyChart en el transcurso de 1 a 2  das hbiles.  Para renovar recetas, por favor pida a su farmacia que se ponga en contacto con nuestra oficina. Nuestro nmero de fax es el [phone].  Si tiene un asunto urgente cuando la clnica est cerrada y que no puede esperar hasta el siguiente da hbil, puede llamar/localizar a su doctor(a) al nmero que aparece a continuacin.   Por favor, tenga en cuenta que aunque hacemos todo lo posible para estar disponibles para asuntos urgentes fuera del horario de oficina, no estamos disponibles las 24 horas del da, los 7 das de la semana.   Si tiene un problema urgente y no puede comunicarse con nosotros, puede optar por buscar atencin mdica  en el consultorio de su doctor(a), en una clnica privada, en un centro de atencin urgente o en una sala de emergencias.  Si tiene una emergencia mdica, por favor llame inmediatamente al 911 o vaya a la sala de emergencias.  Nmeros de bper  - Dr. Kowalski: [phone]  - Dra. Moye: [phone]  - Dra. Stewart: [phone]  En caso de inclemencias del tiempo, por favor llame a nuestra lnea principal al [phone] para una actualizacin sobre el estado de cualquier retraso o cierre.  Consejos para la medicacin en dermatologa: Por favor, guarde las cajas en las que vienen los medicamentos de uso tpico para ayudarle a seguir las instrucciones sobre dnde y cmo usarlos. Las farmacias generalmente imprimen las instrucciones del medicamento slo en las cajas y no directamente en los tubos del medicamento.   Si su medicamento es muy caro, por favor, pngase en contacto con nuestra oficina llamando al [phone] y presione la opcin 4 o envenos un mensaje a travs de MyChart.   No podemos decirle cul ser su copago por los medicamentos por adelantado ya que esto es diferente dependiendo de la cobertura de su seguro. Sin embargo, es posible que podamos encontrar un medicamento sustituto a menor costo o llenar un formulario para que el  seguro cubra el medicamento que se considera necesario.   Si se requiere una autorizacin previa para que su compaa de seguros cubra su medicamento, por favor permtanos de 1 a 2 das hbiles para completar este proceso.  Los precios de los medicamentos varan con frecuencia dependiendo del lugar de dnde se surte la receta y alguna farmacias pueden ofrecer precios ms baratos.  El sitio web www.goodrx.com tiene cupones para medicamentos de diferentes farmacias. Los precios aqu no tienen en cuenta lo que podra costar con la ayuda del seguro (puede ser ms barato con su seguro), pero el sitio web puede darle el precio si no utiliz ningn seguro.  - Puede imprimir el cupn correspondiente y llevarlo con su receta a la farmacia.  - Tambin puede pasar por nuestra oficina durante el horario de atencin regular y recoger una tarjeta de cupones de GoodRx.  - Si necesita que su receta se enve electrnicamente a una farmacia diferente, informe a nuestra oficina a travs de MyChart de [place]   o por telfono llamando al [phone] y presione la opcin 4.  

## 2022-08-11 NOTE — Progress Notes (Signed)
   Follow-Up Visit   Subjective  Isabel Johnson is a 72 y.o. female who presents for the following: Suture removal - L upper back at base of neck, bx proven BCC.  Pathology showed a margins free basal cell carcinoma   The following portions of the chart were reviewed this encounter and updated as appropriate: medications, allergies, medical history  Review of Systems:  No other skin or systemic complaints except as noted in HPI or Assessment and Plan.  Objective  Well appearing patient in no apparent distress; mood and affect are within normal limits.  Areas Examined: The neck   Relevant physical exam findings are noted in the Assessment and Plan.    Assessment & Plan    Encounter for Removal of Sutures - Incision site is clean, dry and intact. - Wound cleansed, sutures removed, wound cleansed and steri strips applied.  - Discussed pathology results showing margins free basal cell carcinoma - Patient advised to keep steri-strips dry until they fall off. - Scars remodel for a full year. - Once steri-strips fall off, patient can apply over-the-counter silicone scar cream once to twice a day to help with scar remodeling if desired. - Patient advised to call with any concerns or if they notice any new or changing lesions.  Return for appointment as scheduled.  Maylene Roes, CMA, am acting as scribe for Darden Dates, MD .  Documentation: I have reviewed the above documentation for accuracy and completeness, and I agree with the above.  Darden Dates, MD

## 2022-08-11 NOTE — Telephone Encounter (Signed)
-----   Message from Sandi Mealy, MD sent at 08/11/2022  8:10 AM EDT ----- Skin (M), left upper back at base of neck RESIDUAL BASAL CELL CARCINOMA, MARGINS FREE Entire lesion appears to be out. No additional treatment needed at this time. Please call our office [phone] with any questions.     MAs please call. Thank you!

## 2022-08-19 ENCOUNTER — Other Ambulatory Visit: Payer: Self-pay | Admitting: Internal Medicine

## 2022-08-19 DIAGNOSIS — Z1231 Encounter for screening mammogram for malignant neoplasm of breast: Secondary | ICD-10-CM

## 2022-09-02 ENCOUNTER — Ambulatory Visit
Admission: RE | Admit: 2022-09-02 | Discharge: 2022-09-02 | Disposition: A | Discharge status: Home / Self Care | Payer: Medicare Other | Source: Ambulatory Visit | Attending: Internal Medicine | Admitting: Internal Medicine

## 2022-09-02 DIAGNOSIS — Z1231 Encounter for screening mammogram for malignant neoplasm of breast: Secondary | ICD-10-CM | POA: Diagnosis present

## 2022-10-14 ENCOUNTER — Ambulatory Visit: Payer: Medicare Other | Attending: Otolaryngology | Admitting: Speech Pathology

## 2022-10-14 DIAGNOSIS — R490 Dysphonia: Secondary | ICD-10-CM | POA: Diagnosis present

## 2022-10-14 NOTE — Therapy (Signed)
OUTPATIENT SPEECH LANGUAGE PATHOLOGY  VOICE EVALUATION   Patient Name: Isabel Johnson MRN: [id number] DOB:06-02-1950, 72 y.o., female Today's Date: 10/14/2022  PCP: Einar Crow, MD REFERRING PROVIDER: Bud Face, MD   End of Session - 10/14/22 1247     Visit Number 1    Number of Visits 17    Date for SLP Re-Evaluation 12/09/22    Authorization Type Medicare A/Medicare B    Progress Note Due on Visit 10    SLP Start Time 1145    SLP Stop Time  1230    SLP Time Calculation (min) 45 min    Activity Tolerance Patient tolerated treatment well             Past Medical History:  Diagnosis Date   Actinic keratosis    Basal cell carcinoma 07/07/2022   Left upper back at base of neck. Superficial and nodular. Excision 08/04/22   Dysplastic nevus 07/07/2022   Left medial knee. Severe atypia, irritated. Close to margin. Excised 07/21/22, margins free   Endometrial polyp    Endometriosis    Hyperlipidemia    PMB (postmenopausal bleeding)    PONV (postoperative nausea and vomiting)    Squamous cell carcinoma of skin 04/06/2022   Left distal dorsal forearm. KA-type. EDC   Type 2 diabetes mellitus (HCC)    Wears contact lenses    Past Surgical History:  Procedure Laterality Date   BREAST BIOPSY Right 08/15/2020   Stereo Bx, x-clip, BENIGN BREAST TISSUE, MOSTLY FATTY, WITH USUAL DUCTAL HYPERPLASIA AND CYSTS. - NEGATIVE FOR ATYPIA AND MALIGNANCY.   DILATATION & CURETTAGE/HYSTEROSCOPY WITH MYOSURE N/A 03/17/2015   Procedure: DILATATION & CURETTAGE/HYSTEROSCOPY WITH MYOSURE;  Surgeon: Richardean Chimera, MD;  Location: [facility];  Service: Gynecology;  Laterality: N/A;   LAPAROSCOPY  1980   endometriosis   Patient Active Problem List   Diagnosis Date Noted   Diabetes mellitus without complication (HCC)    Esophageal dysphagia 03/08/2014   Routine general medical examination at a health care facility 12/24/2010   ENDOMETRIOSIS NOS 07/01/2006    ONSET DATE:   reports symptoms started 5 months ago; 09/28/2022 date of referral  REFERRING DIAG: Dysphonia (R49.0)  THERAPY DIAG:  Dysphonia  Rationale for Evaluation and Treatment Rehabilitation  SUBJECTIVE:   SUBJECTIVE STATEMENT: Pt pleasant, appears to be good historian Pt accompanied by: self  PERTINENT HISTORY: Pt is a 72 year old female with past medical history of bilateral hearing loss, tinnitus, vertigo and voice change ~ 5 months prior. Pt also reports globus sensation when eating. Past mention of esophageal dysphagia by Dr Tillman Abide during pt's appt on 12/11/20215.  Gets food stuck in throat at times Once a month--no particular food  No heartburn or water brash--but does get epigastric pain at times. TUMs doesn't relieve. 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   PAIN:  Are you having pain? No   FALLS: Has patient fallen in last 6 months? No,   LIVING ENVIRONMENT: Lives with: lives with their spouse Lives in: House/apartment  PLOF: Independent  PATIENT GOALS    to get her voice back  OBJECTIVE:  COGNITION: Overall cognitive status: Within functional limits for tasks assessed  SOCIAL HISTORY: Occupation: retired Counsellor intake: optimal Caffeine/alcohol intake: minimal Daily voice use: minimal Environmental risks: None reported Occupational risks: None identified Misuse: Excessively high pitch, Strain, Tension, and Speaks on  residual capacity Phonotraumatic behaviors: Other: occasional throat clears in the morning  PERCEPTUAL VOICE ASSESSMENT: Voice quality: breathy, harsh, rough, strained, and vocal fatigue Vocal abuse: habitual abnormal pitch Resonance: normal Respiratory function: diaphragmatic/abdominal breathing  OBJECTIVE VOICE ASSESSMENT: Sustained "ah" maximum phonation time: 2.6 seconds Sustained "ah" loudness  average: 74 dB Average fundamental frequency during sustained "ah":272 Hz   (1 SD above average of  244 Hz +/- 27 for gender)  Oral reading (passage) loudness average: 71 dB Oral reading loudness range: 22 dB (61 dB to 83 dB) Conversational pitch average: 232 Hz Highest dynamic pitch in conversational speech: 363 Hz Lowest dynamic pitch in conversational speech: 107 Hz Conversational pitch range: 256 Hz Conversational loudness average: 72 dB Conversational loudness range: 23 dB S/z ratio: 2.7 (Suggestive of dysfunction >1.0) Voice quality: breathy, harsh, rough, strained, and vocal fatigue      ORAL MOTOR EXAMINATION Facial : WFL Lingual: WFL Velum: WFL Mandible: WFL Cough: WFL   PATIENT REPORTED OUTCOME MEASURES (PROM):  VOICE HANDICAP INDEX (VHI)  The Voice Handicap Index is comprised of a series of questions to assess the patient's perception of their voice. It is designed to evaluate the emotional, physical and functional components of the voice problem.  Functional: 7 Physical: 19 Emotional: 8 Total: 34 (Normal mean 8.75, SD =14.97)  z score =  1.7 (mild = 1.01-1.99)     PATIENT EDUCATION: Education details: results of this assessment Person educated: Patient Education method: Explanation Education comprehension: needs further education   HOME EXERCISE PROGRAM: N/A     GOALS: Goals reviewed with patient? Yes  SHORT TERM GOALS: Target date: 10 sessions  The patient will decrease laryngeal and articulatory muscle tension by independently completing relaxation/stretching exercises.  Baseline: Goal status: INITIAL  2.  The patient will minimize vocal tension via resonant voice therapy (or comparable technique) with min SLP cues with 80% accuracy.   Baseline:  Goal status: INITIAL  3.  The patient will demonstrate abdominal breathing patterns and steady release of breath on exhalation to optimize efficiency of voicing and decrease laryngeal  hyperfunction.   Baseline:  Goal status: INITIAL   LONG TERM GOALS: Target date: 12/09/2022  The patient will maintain relaxed phonation for paragraph length recitation with 80% accuracy.  Baseline:  Goal status: INITIAL  2.  The patient will be independent for abdominal breathing and breath support exercises.   Baseline:  Goal status: INITIAL  3.  The patient will demonstrate independent understanding of vocal hygiene concepts.   Baseline:  Goal status: INITIAL  4.  Patient will report improved communication effectiveness as measured by PROM   Baseline:  Goal status: INITIAL   ASSESSMENT:  CLINICAL IMPRESSION: Patient is a 72 y.o. female who was seen today for a Qualitative Voice Evaluation. She presents with mild dysphonia that is c/b strain, harsh, raspy vocal quality and intermittent pitch breaks. She was also observed speaking on residual respiratory support.  Pt also describes globus sensation with some solids and coughing when laying down at night to sleep.   OBJECTIVE IMPAIRMENTS include voice disorder. These impairments are limiting patient from effectively communicating at home and in community and safety when swallowing. Factors affecting potential to achieve goals and functional outcome are  unknown etiology and time post onset . Patient will benefit from skilled SLP services to address above impairments and improve overall function.  REHAB POTENTIAL: Good  PLAN: SLP FREQUENCY: 1-2x/week  SLP DURATION: 8 weeks  PLANNED INTERVENTIONS: SLP instruction and feedback and Patient/family  education    Arnell Mausolf B. Dreama Saa, M.S., CCC-SLP, Tree surgeon Certified Brain Injury Specialist [facility]  [facility] Rehabilitation Services Office [phone] Ascom [phone] Fax [phone]

## 2022-10-18 ENCOUNTER — Ambulatory Visit: Payer: Medicare Other | Admitting: Speech Pathology

## 2022-10-18 DIAGNOSIS — R490 Dysphonia: Secondary | ICD-10-CM | POA: Diagnosis not present

## 2022-10-18 NOTE — Therapy (Unsigned)
OUTPATIENT SPEECH LANGUAGE PATHOLOGY  TREATMENT NOTE   Patient Name: Isabel Johnson MRN: [id number] DOB:[birth year], 72 y.o., female Today's Date: 10/18/2022  PCP: Einar Crow, MD REFERRING PROVIDER: Bud Face, MD   End of Session - 10/18/22 1359     Visit Number 2    Number of Visits 17    Date for SLP Re-Evaluation 12/09/22    Authorization Type Medicare A/Medicare B    Progress Note Due on Visit 10    SLP Start Time 1400    SLP Stop Time  1445    SLP Time Calculation (min) 45 min    Activity Tolerance Patient tolerated treatment well             Past Medical History:  Diagnosis Date   Actinic keratosis    Basal cell carcinoma 07/07/2022   Left upper back at base of neck. Superficial and nodular. Excision 08/04/22   Dysplastic nevus 07/07/2022   Left medial knee. Severe atypia, irritated. Close to margin. Excised 07/21/22, margins free   Endometrial polyp    Endometriosis    Hyperlipidemia    PMB (postmenopausal bleeding)    PONV (postoperative nausea and vomiting)    Squamous cell carcinoma of skin 04/06/2022   Left distal dorsal forearm. KA-type. EDC   Type 2 diabetes mellitus (HCC)    Wears contact lenses    Past Surgical History:  Procedure Laterality Date   BREAST BIOPSY Right 08/15/2020   Stereo Bx, x-clip, BENIGN BREAST TISSUE, MOSTLY FATTY, WITH USUAL DUCTAL HYPERPLASIA AND CYSTS. - NEGATIVE FOR ATYPIA AND MALIGNANCY.   DILATATION & CURETTAGE/HYSTEROSCOPY WITH MYOSURE N/A 03/17/2015   Procedure: DILATATION & CURETTAGE/HYSTEROSCOPY WITH MYOSURE;  Surgeon: Richardean Chimera, MD;  Location: [facility];  Service: Gynecology;  Laterality: N/A;   LAPAROSCOPY  1980   endometriosis   Patient Active Problem List   Diagnosis Date Noted   Diabetes mellitus without complication (HCC)    Esophageal dysphagia 03/08/2014   Routine general medical examination at a health care facility 12/24/2010   ENDOMETRIOSIS NOS 07/01/2006    ONSET DATE:   reports symptoms started 5 months ago; 09/28/2022 date of referral  REFERRING DIAG: Dysphonia (R49.0)  THERAPY DIAG:  Dysphonia  Rationale for Evaluation and Treatment Rehabilitation  SUBJECTIVE:   SUBJECTIVE STATEMENT: Pt pleasant, appears to be good historian Pt accompanied by: self  PERTINENT HISTORY: Pt is a 72 year old female with past medical history of bilateral hearing loss, tinnitus, vertigo and voice change ~ 5 months prior. Pt also reports globus sensation when eating. Past mention of esophageal dysphagia by Dr Tillman Abide during pt's appt on 12/11/20215.  Gets food stuck in throat at times Once a month--no particular food  No heartburn or water brash--but does get epigastric pain at times. TUMs doesn't relieve. 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   PAIN:  Are you having pain? No   FALLS: Has patient fallen in last 6 months? No,   LIVING ENVIRONMENT: Lives with: lives with their spouse Lives in: House/apartment  PLOF: Independent  PATIENT GOALS    to get her voice back  OBJECTIVE:   TODAY'S TREATMENT: Skilled treatment session focused on pt's dysphonia goals. SLP facilitated session by providing the following interventions:  Extensive education provided that Neck range of motion exercises should be done to the point of feeling a GENTLE, TOLERABLE stretch only.  Demonstration provided with pt able to imitate for the following stretches:  Head Tilt: Forward and Back - Gently bow your head and try to touch your chin to your chest. Raise your chin back to the starting position. Tilt your head back as far as possible so you are looking up at the ceiling. Return your head to the starting position. - Mod I - but minimal movement noted  Head Tilt: Side to Side: Tilt your head to the side, bringing your ear toward your shoulder.  Do not raise your shoulder to your ear. Keep your shoulder still. Return your head to the starting position. Min A required to keep shoulders lowered  Head turns: Turn your head to look over your shoulder. Tilt your chin down and try to touch it to your shoulder. Do not raise your shoulder to your chin. Face forward again -Min A required to keep shoulders lowered, minimal movement noted   Further information provided on hydration, habitual throat clearing (including strategies to reduce throat clearing) and education on how the voice works. Pt able to suppress throat clear thru use of hard swallow and consuming water via cup throughout the session.   After instruction and demonstration, pt was Mod I for abdominal breathing but was not able to use abdominal breathing with production of phoneme /s/.   PATIENT EDUCATION: Education details: see above Person educated: Patient Education method: Explanation Education comprehension: needs further education   HOME EXERCISE PROGRAM: Abdominal breathing Use of pectin based throat lozenge   GOALS: Goals reviewed with patient? Yes  SHORT TERM GOALS: Target date: 10 sessions  The patient will decrease laryngeal and articulatory muscle tension by independently completing relaxation/stretching exercises.  Baseline: Goal status: INITIAL  2.  The patient will minimize vocal tension via resonant voice therapy (or comparable technique) with min SLP cues with 80% accuracy.   Baseline:  Goal status: INITIAL  3.  The patient will demonstrate abdominal breathing patterns and steady release of breath on exhalation to optimize efficiency of voicing and decrease laryngeal hyperfunction.   Baseline:  Goal status: INITIAL   LONG TERM GOALS: Target date: 12/09/2022  The patient will maintain relaxed phonation for paragraph length recitation with 80% accuracy.  Baseline:  Goal status: INITIAL  2.  The patient will be independent for abdominal breathing  and breath support exercises.   Baseline:  Goal status: INITIAL  3.  The patient will demonstrate independent understanding of vocal hygiene concepts.   Baseline:  Goal status: INITIAL  4.  Patient will report improved communication effectiveness as measured by PROM   Baseline:  Goal status: INITIAL   ASSESSMENT:  CLINICAL IMPRESSION: Patient is a 72 y.o. female who was seen today for voice therapy d/t mild dysphonia that is c/b strain, harsh, raspy vocal quality and intermittent pitch breaks. She was also observed speaking on residual respiratory support.  See the above treatment note for details.   OBJECTIVE IMPAIRMENTS include voice disorder. These impairments are limiting patient from effectively communicating at home and in community and safety when swallowing. Factors affecting potential to achieve goals and functional outcome are  unknown etiology and time post onset . Patient will benefit from skilled SLP services to address above impairments and improve overall function.  REHAB POTENTIAL: Good  PLAN: SLP FREQUENCY: 1-2x/week  SLP DURATION: 8 weeks  PLANNED INTERVENTIONS: SLP instruction and feedback and Patient/family education   Taeko Schaffer B. Dreama Saa, M.S., CCC-SLP, CBIS Speech-Language Pathologist Certified Brain Injury Specialist [facility] Office [phone] Ascom [phone] Fax [phone]

## 2022-10-20 ENCOUNTER — Ambulatory Visit: Payer: Medicare Other | Admitting: Speech Pathology

## 2022-10-20 DIAGNOSIS — R490 Dysphonia: Secondary | ICD-10-CM | POA: Diagnosis not present

## 2022-10-20 NOTE — Therapy (Signed)
OUTPATIENT SPEECH LANGUAGE PATHOLOGY  TREATMENT NOTE   Patient Name: Isabel Johnson MRN: [id number] DOB:30-Oct-1950, 72 y.o., female Today's Date: 10/20/2022  PCP: Einar Crow, MD REFERRING PROVIDER: Bud Face, MD   End of Session - 10/20/22 1704     Visit Number 3    Number of Visits 17    Date for SLP Re-Evaluation 12/09/22    Authorization Type Medicare A/Medicare B    Progress Note Due on Visit 10    SLP Start Time 1445    SLP Stop Time  1530    SLP Time Calculation (min) 45 min    Activity Tolerance Patient tolerated treatment well             Past Medical History:  Diagnosis Date   Actinic keratosis    Basal cell carcinoma 07/07/2022   Left upper back at base of neck. Superficial and nodular. Excision 08/04/22   Dysplastic nevus 07/07/2022   Left medial knee. Severe atypia, irritated. Close to margin. Excised 07/21/22, margins free   Endometrial polyp    Endometriosis    Hyperlipidemia    PMB (postmenopausal bleeding)    PONV (postoperative nausea and vomiting)    Squamous cell carcinoma of skin 04/06/2022   Left distal dorsal forearm. KA-type. EDC   Type 2 diabetes mellitus (HCC)    Wears contact lenses    Past Surgical History:  Procedure Laterality Date   BREAST BIOPSY Right 08/15/2020   Stereo Bx, x-clip, BENIGN BREAST TISSUE, MOSTLY FATTY, WITH USUAL DUCTAL HYPERPLASIA AND CYSTS. - NEGATIVE FOR ATYPIA AND MALIGNANCY.   DILATATION & CURETTAGE/HYSTEROSCOPY WITH MYOSURE N/A 03/17/2015   Procedure: DILATATION & CURETTAGE/HYSTEROSCOPY WITH MYOSURE;  Surgeon: Richardean Chimera, MD;  Location: [facility];  Service: Gynecology;  Laterality: N/A;   LAPAROSCOPY  1980   endometriosis   Patient Active Problem List   Diagnosis Date Noted   Diabetes mellitus without complication (HCC)    Esophageal dysphagia 03/08/2014   Routine general medical examination at a health care facility 12/24/2010   ENDOMETRIOSIS NOS 07/01/2006    ONSET DATE:   reports symptoms started 5 months ago; 09/28/2022 date of referral  REFERRING DIAG: Dysphonia (R49.0)  THERAPY DIAG:  Dysphonia  Rationale for Evaluation and Treatment Rehabilitation  SUBJECTIVE:   PERTINENT HISTORY: Pt is a 72 year old female with past medical history of bilateral hearing loss, tinnitus, vertigo and voice change ~ 5 months prior. Pt also reports globus sensation when eating. Past mention of esophageal dysphagia by Dr Tillman Abide during pt's appt on 12/11/20215.  Gets food stuck in throat at times Once a month--no particular food  No heartburn or water brash--but does get epigastric pain at times. TUMs doesn't relieve. 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   PAIN:  Are you having pain? No   FALLS: Has patient fallen in last 6 months? No,   LIVING ENVIRONMENT: Lives with: lives with their spouse Lives in: House/apartment  PLOF: Independent  PATIENT GOALS    to get her voice back  SUBJECTIVE STATEMENT: Pt pleasant, appears to be good historian Pt accompanied by: self  OBJECTIVE:   TODAY'S TREATMENT: Skilled treatment session focused on pt's dysphonia goals. SLP facilitated session by providing the following interventions:  Pt reports increased "raspiness" of her voice following completion of neck stretches. Not observed during today's session following stretches.   Extensive  education provided that Neck range of motion exercises should be done to the point of feeling a GENTLE, TOLERABLE stretch only. Demonstration provided with pt able to imitate for the following stretches:  Head Tilt: Forward and Back - Gently bow your head and try to touch your chin to your chest. Raise your chin back to the starting position. Tilt your head back as far as possible so you are looking up at the ceiling. Return your head to the  starting position. - Mod I -   Head Tilt: Side to Side: Tilt your head to the side, bringing your ear toward your shoulder. Do not raise your shoulder to your ear. Keep your shoulder still. Return your head to the starting position. Min A cues to slow rate  Head turns: Turn your head to look over your shoulder. Tilt your chin down and try to touch it to your shoulder. Do not raise your shoulder to your chin. Face forward again -Mod I to slow rate  Patient instructed in flow phonation through skill level 1: establish airflow release: Unarticulated: significant improvement in duration of airflow.  Articulated (unvoiced): patient demonstrates improving mastery of maintaining airflow while moving articulators.  Initiated skill level 2-  Airflow + Voicing: Unarticulated:  good response to easy onset of voicing; Articulated:  having best response to imitating therapist with meaningful phrases thru 3 phrases before strain was heard.   PATIENT EDUCATION: Education details: see above Person educated: Patient Education method: Explanation Education comprehension: needs further education   HOME EXERCISE PROGRAM: Abdominal breathing Use of pectin based throat lozenge Flow phonation   GOALS: Goals reviewed with patient? Yes  SHORT TERM GOALS: Target date: 10 sessions  The patient will decrease laryngeal and articulatory muscle tension by independently completing relaxation/stretching exercises.  Baseline: Goal status: INITIAL  2.  The patient will minimize vocal tension via resonant voice therapy (or comparable technique) with min SLP cues with 80% accuracy.   Baseline:  Goal status: INITIAL  3.  The patient will demonstrate abdominal breathing patterns and steady release of breath on exhalation to optimize efficiency of voicing and decrease laryngeal hyperfunction.   Baseline:  Goal status: INITIAL   LONG TERM GOALS: Target date: 12/09/2022  The patient will maintain relaxed phonation  for paragraph length recitation with 80% accuracy.  Baseline:  Goal status: INITIAL  2.  The patient will be independent for abdominal breathing and breath support exercises.   Baseline:  Goal status: INITIAL  3.  The patient will demonstrate independent understanding of vocal hygiene concepts.   Baseline:  Goal status: INITIAL  4.  Patient will report improved communication effectiveness as measured by PROM   Baseline:  Goal status: INITIAL   ASSESSMENT:  CLINICAL IMPRESSION: Patient is a 72 y.o. female who was seen today for voice therapy d/t mild dysphonia that is c/b strain, harsh, raspy vocal quality and intermittent pitch breaks. She was also observed speaking on residual respiratory support. Pt with improved vocal quality following voiceless productions in flow phonation. See the above treatment note for details.   OBJECTIVE IMPAIRMENTS include voice disorder. These impairments are limiting patient from effectively communicating at home and in community and safety when swallowing. Factors affecting potential to achieve goals and functional outcome are  unknown etiology and time post onset . Patient will benefit from skilled SLP services to address above impairments and improve overall function.  REHAB POTENTIAL: Good  PLAN: SLP FREQUENCY: 1-2x/week  SLP DURATION: 8 weeks  PLANNED INTERVENTIONS: SLP  instruction and feedback and Patient/family education   Rameen Quinney B. Dreama Saa, M.S., CCC-SLP, Tree surgeon Certified Brain Injury Specialist [facility]  [facility] Rehabilitation Services Office [phone] Ascom [phone] Fax [phone]

## 2022-10-25 ENCOUNTER — Ambulatory Visit: Payer: Medicare Other | Admitting: Speech Pathology

## 2022-10-25 DIAGNOSIS — R490 Dysphonia: Secondary | ICD-10-CM

## 2022-10-25 NOTE — Therapy (Signed)
OUTPATIENT SPEECH LANGUAGE PATHOLOGY  TREATMENT NOTE   Patient Name: Isabel Johnson MRN: [id number] DOB:06/27/1950, 72 y.o., female Today's Date: 10/25/2022  PCP: Einar Crow, MD REFERRING PROVIDER: Bud Face, MD   End of Session - 10/25/22 1402     Visit Number 4    Number of Visits 17    Date for SLP Re-Evaluation 12/09/22    Authorization Type Medicare A/Medicare B    Progress Note Due on Visit 10    SLP Start Time 1400    SLP Stop Time  1445    SLP Time Calculation (min) 45 min    Activity Tolerance Patient tolerated treatment well             Past Medical History:  Diagnosis Date   Actinic keratosis    Basal cell carcinoma 07/07/2022   Left upper back at base of neck. Superficial and nodular. Excision 08/04/22   Dysplastic nevus 07/07/2022   Left medial knee. Severe atypia, irritated. Close to margin. Excised 07/21/22, margins free   Endometrial polyp    Endometriosis    Hyperlipidemia    PMB (postmenopausal bleeding)    PONV (postoperative nausea and vomiting)    Squamous cell carcinoma of skin 04/06/2022   Left distal dorsal forearm. KA-type. EDC   Type 2 diabetes mellitus (HCC)    Wears contact lenses    Past Surgical History:  Procedure Laterality Date   BREAST BIOPSY Right 08/15/2020   Stereo Bx, x-clip, BENIGN BREAST TISSUE, MOSTLY FATTY, WITH USUAL DUCTAL HYPERPLASIA AND CYSTS. - NEGATIVE FOR ATYPIA AND MALIGNANCY.   DILATATION & CURETTAGE/HYSTEROSCOPY WITH MYOSURE N/A 03/17/2015   Procedure: DILATATION & CURETTAGE/HYSTEROSCOPY WITH MYOSURE;  Surgeon: Richardean Chimera, MD;  Location: [facility] [address];  Service: Gynecology;  Laterality: N/A;   LAPAROSCOPY  1980   endometriosis   Patient Active Problem List   Diagnosis Date Noted   Diabetes mellitus without complication (HCC)    Esophageal dysphagia 03/08/2014   Routine general medical examination at a health care facility 12/24/2010   ENDOMETRIOSIS NOS 07/01/2006    ONSET DATE:   reports symptoms started 5 months ago; 09/28/2022 date of referral  REFERRING DIAG: Dysphonia (R49.0)  THERAPY DIAG:  Dysphonia  Rationale for Evaluation and Treatment Rehabilitation  SUBJECTIVE:   PERTINENT HISTORY: Pt is a 72 year old female with past medical history of bilateral hearing loss, tinnitus, vertigo and voice change ~ 5 months prior. Pt also reports globus sensation when eating. Past mention of esophageal dysphagia by Dr Tillman Abide during pt's appt on 12/11/20215.  Gets food stuck in throat at times Once a month--no particular food  No heartburn or water brash--but does get epigastric pain at times. TUMs doesn't relieve. 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   PAIN:  Are you having pain? No   FALLS: Has patient fallen in last 6 months? No,   LIVING ENVIRONMENT: Lives with: lives with their spouse Lives in: House/apartment  PLOF: Independent  PATIENT GOALS    to get her voice back  SUBJECTIVE STATEMENT: Pt pleasant, appears to be good historian Pt accompanied by: self  OBJECTIVE:   TODAY'S TREATMENT: Skilled treatment session focused on pt's dysphonia goals. SLP facilitated session by providing the following interventions:  Pt voiced difficulty completing HEP d/t acute illness with her dog. She also reported that she begin experiencing throat pain during and  after completion of flow phonation unvoiced articulated phrases/sentences.   While pt was independent in completing neck stretches, she maintained a frown on her face throughout indicating decreased relaxation. Pt provided increased areas of stress increased areas of stress since last session. SLP provided listening ear and encouragement.   When pt completed articulate (unvoiced) in today's session, strain and tension could be heard in pt's attempts. While pt was  able to hear a difference between her productions and this writers, she was only partially able to achieve without tension or stress.    PATIENT EDUCATION: Education details: see above Person educated: Patient Education method: Explanation Education comprehension: needs further education   HOME EXERCISE PROGRAM: Abdominal breathing Use of pectin based throat lozenge Neck stretches   GOALS: Goals reviewed with patient? Yes  SHORT TERM GOALS: Target date: 10 sessions  The patient will decrease laryngeal and articulatory muscle tension by independently completing relaxation/stretching exercises.  Baseline: Goal status: INITIAL  2.  The patient will minimize vocal tension via resonant voice therapy (or comparable technique) with min SLP cues with 80% accuracy.   Baseline:  Goal status: INITIAL  3.  The patient will demonstrate abdominal breathing patterns and steady release of breath on exhalation to optimize efficiency of voicing and decrease laryngeal hyperfunction.   Baseline:  Goal status: INITIAL   LONG TERM GOALS: Target date: 12/09/2022  The patient will maintain relaxed phonation for paragraph length recitation with 80% accuracy.  Baseline:  Goal status: INITIAL  2.  The patient will be independent for abdominal breathing and breath support exercises.   Baseline:  Goal status: INITIAL  3.  The patient will demonstrate independent understanding of vocal hygiene concepts.   Baseline:  Goal status: INITIAL  4.  Patient will report improved communication effectiveness as measured by PROM   Baseline:  Goal status: INITIAL   ASSESSMENT:  CLINICAL IMPRESSION: Patient is a 72 y.o. female who was seen today for voice therapy d/t mild dysphonia that is c/b strain, harsh, raspy vocal quality and intermittent pitch breaks. She was also observed speaking on residual respiratory support. Pt with difficulty achieving unvoiced productions without tension during  today's session. See the above treatment note for details.   OBJECTIVE IMPAIRMENTS include voice disorder. These impairments are limiting patient from effectively communicating at home and in community and safety when swallowing. Factors affecting potential to achieve goals and functional outcome are  unknown etiology and time post onset . Patient will benefit from skilled SLP services to address above impairments and improve overall function.  REHAB POTENTIAL: Good  PLAN: SLP FREQUENCY: 1-2x/week  SLP DURATION: 8 weeks  PLANNED INTERVENTIONS: SLP instruction and feedback and Patient/family education   Danyell Awbrey B. Dreama Saa, M.S., CCC-SLP, Tree surgeon Certified Brain Injury Specialist [facility]  [facility] Rehabilitation Services Office [phone] Ascom [phone] Fax [phone]

## 2022-10-28 ENCOUNTER — Ambulatory Visit: Payer: Medicare Other | Attending: Otolaryngology | Admitting: Speech Pathology

## 2022-10-28 DIAGNOSIS — R490 Dysphonia: Secondary | ICD-10-CM | POA: Diagnosis not present

## 2022-10-28 NOTE — Therapy (Signed)
OUTPATIENT SPEECH LANGUAGE PATHOLOGY  TREATMENT NOTE   Patient Name: Isabel Johnson MRN: [id number] DOB:07-Oct-1950, 72 y.o., female Today's Date: 10/28/2022  PCP: Einar Crow, MD REFERRING PROVIDER: Bud Face, MD   End of Session - 10/28/22 1151     Visit Number 5    Number of Visits 17    Date for SLP Re-Evaluation 12/09/22    Authorization Type Medicare A/Medicare B    Progress Note Due on Visit 10    SLP Start Time 1145    SLP Stop Time  1230    SLP Time Calculation (min) 45 min    Activity Tolerance Patient tolerated treatment well             Past Medical History:  Diagnosis Date   Actinic keratosis    Basal cell carcinoma 07/07/2022   Left upper back at base of neck. Superficial and nodular. Excision 08/04/22   Dysplastic nevus 07/07/2022   Left medial knee. Severe atypia, irritated. Close to margin. Excised 07/21/22, margins free   Endometrial polyp    Endometriosis    Hyperlipidemia    PMB (postmenopausal bleeding)    PONV (postoperative nausea and vomiting)    Squamous cell carcinoma of skin 04/06/2022   Left distal dorsal forearm. KA-type. EDC   Type 2 diabetes mellitus (HCC)    Wears contact lenses    Past Surgical History:  Procedure Laterality Date   BREAST BIOPSY Right 08/15/2020   Stereo Bx, x-clip, BENIGN BREAST TISSUE, MOSTLY FATTY, WITH USUAL DUCTAL HYPERPLASIA AND CYSTS. - NEGATIVE FOR ATYPIA AND MALIGNANCY.   DILATATION & CURETTAGE/HYSTEROSCOPY WITH MYOSURE N/A 03/17/2015   Procedure: DILATATION & CURETTAGE/HYSTEROSCOPY WITH MYOSURE;  Surgeon: Richardean Chimera, MD;  Location: [facility];  Service: Gynecology;  Laterality: N/A;   LAPAROSCOPY  1980   endometriosis   Patient Active Problem List   Diagnosis Date Noted   Diabetes mellitus without complication (HCC)    Esophageal dysphagia 03/08/2014   Routine general medical examination at a health care facility 12/24/2010   ENDOMETRIOSIS NOS 07/01/2006    ONSET DATE:   reports symptoms started 5 months ago; 09/28/2022 date of referral  REFERRING DIAG: Dysphonia (R49.0)  THERAPY DIAG:  Dysphonia  Rationale for Evaluation and Treatment Rehabilitation  SUBJECTIVE:   PERTINENT HISTORY: Pt is a 72 year old female with past medical history of bilateral hearing loss, tinnitus, vertigo and voice change ~ 5 months prior. Pt also reports globus sensation when eating. Past mention of esophageal dysphagia by Dr Tillman Abide during pt's appt on 12/11/20215.  Gets food stuck in throat at times Once a month--no particular food  No heartburn or water brash--but does get epigastric pain at times. TUMs doesn't relieve. 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   PAIN:  Are you having pain? No   FALLS: Has patient fallen in last 6 months? No,   LIVING ENVIRONMENT: Lives with: lives with their spouse Lives in: House/apartment  PLOF: Independent  PATIENT GOALS    to get her voice back  SUBJECTIVE STATEMENT: Pt pleasant, appears to be good historian Pt accompanied by: self  OBJECTIVE:   TODAY'S TREATMENT: Skilled treatment session focused on pt's dysphonia goals. SLP facilitated session by providing the following interventions:  Extensive education provided that Neck range of motion exercises should be done to the point of feeling a GENTLE, TOLERABLE stretch only.  Demonstration provided with pt able to imitate for the following stretches:  Head Tilt: Forward and Back - Gently bow your head and try to touch your chin to your chest. Raise your chin back to the starting position. Tilt your head back as far as possible so you are looking up at the ceiling. Return your head to the starting position. -  INDEPENDENT  Head Tilt: Side to Side: Tilt your head to the side, bringing your ear toward your shoulder. Do not raise your  shoulder to your ear. Keep your shoulder still. Return your head to the starting position. INDEPENDENT  Head turns: Turn your head to look over your shoulder. Tilt your chin down and try to touch it to your shoulder. Do not raise your shoulder to your chin. Face forward again -INDEPENDENT  Patient instructed in flow phonation through skill level 1: establish airflow release: Unarticulated: significant improvement in duration of airflow.  Articulated (unvoiced): patient demonstrates improving mastery of maintaining airflow while moving articulators.  Initiated skill level 2-  Airflow + Voicing: Unarticulated:  Independent; Articulated thru the phrase level - Independent with maintaining relaxed voicing   PATIENT EDUCATION: Education details: see above Person educated: Patient Education method: Explanation Education comprehension: needs further education   HOME EXERCISE PROGRAM: Abdominal breathing Use of pectin based throat lozenge Neck stretches Flow phonation thru voiced+articulated phrases   GOALS: Goals reviewed with patient? Yes  SHORT TERM GOALS: Target date: 10 sessions  The patient will decrease laryngeal and articulatory muscle tension by independently completing relaxation/stretching exercises.  Baseline: Goal status: INITIAL  2.  The patient will minimize vocal tension via resonant voice therapy (or comparable technique) with min SLP cues with 80% accuracy.   Baseline:  Goal status: INITIAL  3.  The patient will demonstrate abdominal breathing patterns and steady release of breath on exhalation to optimize efficiency of voicing and decrease laryngeal hyperfunction.   Baseline:  Goal status: INITIAL   LONG TERM GOALS: Target date: 12/09/2022  The patient will maintain relaxed phonation for paragraph length recitation with 80% accuracy.  Baseline:  Goal status: INITIAL  2.  The patient will be independent for abdominal breathing and breath support exercises.    Baseline:  Goal status: INITIAL  3.  The patient will demonstrate independent understanding of vocal hygiene concepts.   Baseline:  Goal status: INITIAL  4.  Patient will report improved communication effectiveness as measured by PROM   Baseline:  Goal status: INITIAL   ASSESSMENT:  CLINICAL IMPRESSION: Patient is a 72 y.o. female who was seen today for voice therapy d/t mild dysphonia that is c/b strain, harsh, raspy vocal quality and intermittent pitch breaks. She was also observed speaking on residual respiratory support.Pt with much improvement throughout today's session, increased ability to produce relaxed voicing observed.  See the above treatment note for details.   OBJECTIVE IMPAIRMENTS include voice disorder. These impairments are limiting patient from effectively communicating at home and in community and safety when swallowing. Factors affecting potential to achieve goals and functional outcome are  unknown etiology and time post onset . Patient will benefit from skilled SLP services to address above impairments and improve overall function.  REHAB POTENTIAL: Good  PLAN: SLP FREQUENCY: 1-2x/week  SLP DURATION: 8 weeks  PLANNED INTERVENTIONS: SLP instruction and feedback and Patient/family education   Kasim Mccorkle B. Dreama Saa, M.S., CCC-SLP, Tree surgeon Certified Brain Injury Specialist [facility]  [facility] Rehabilitation Services Office [phone] Ascom [phone] Fax [phone]

## 2022-11-02 ENCOUNTER — Ambulatory Visit: Payer: Medicare Other | Admitting: Speech Pathology

## 2022-11-08 ENCOUNTER — Ambulatory Visit: Payer: Medicare Other | Admitting: Speech Pathology

## 2022-11-08 DIAGNOSIS — R490 Dysphonia: Secondary | ICD-10-CM | POA: Diagnosis not present

## 2022-11-08 NOTE — Therapy (Signed)
OUTPATIENT SPEECH LANGUAGE PATHOLOGY  TREATMENT NOTE   Patient Name: Isabel Johnson MRN: [id number] DOB:September 27, 1950, 72 y.o., female Today's Date: 11/08/2022  PCP: Einar Crow, MD REFERRING PROVIDER: Bud Face, MD   End of Session - 11/08/22 1317     Visit Number 6    Number of Visits 17    Date for SLP Re-Evaluation 12/09/22    Authorization Type Medicare A/Medicare B    Progress Note Due on Visit 10    SLP Start Time 1315    SLP Stop Time  1400    SLP Time Calculation (min) 45 min    Activity Tolerance Patient tolerated treatment well             Past Medical History:  Diagnosis Date   Actinic keratosis    Basal cell carcinoma 07/07/2022   Left upper back at base of neck. Superficial and nodular. Excision 08/04/22   Dysplastic nevus 07/07/2022   Left medial knee. Severe atypia, irritated. Close to margin. Excised 07/21/22, margins free   Endometrial polyp    Endometriosis    Hyperlipidemia    PMB (postmenopausal bleeding)    PONV (postoperative nausea and vomiting)    Squamous cell carcinoma of skin 04/06/2022   Left distal dorsal forearm. KA-type. EDC   Type 2 diabetes mellitus (HCC)    Wears contact lenses    Past Surgical History:  Procedure Laterality Date   BREAST BIOPSY Right 08/15/2020   Stereo Bx, x-clip, BENIGN BREAST TISSUE, MOSTLY FATTY, WITH USUAL DUCTAL HYPERPLASIA AND CYSTS. - NEGATIVE FOR ATYPIA AND MALIGNANCY.   DILATATION & CURETTAGE/HYSTEROSCOPY WITH MYOSURE N/A 03/17/2015   Procedure: DILATATION & CURETTAGE/HYSTEROSCOPY WITH MYOSURE;  Surgeon: Richardean Chimera, MD;  Location: [facility] [place];  Service: Gynecology;  Laterality: N/A;   LAPAROSCOPY  1980   endometriosis   Patient Active Problem List   Diagnosis Date Noted   Diabetes mellitus without complication (HCC)    Esophageal dysphagia 03/08/2014   Routine general medical examination at a health care facility 12/24/2010   ENDOMETRIOSIS NOS 07/01/2006    ONSET DATE:   reports symptoms started 5 months ago; 09/28/2022 date of referral  REFERRING DIAG: Dysphonia (R49.0)  THERAPY DIAG:  Dysphonia  Rationale for Evaluation and Treatment Rehabilitation  SUBJECTIVE:   PERTINENT HISTORY: Pt is a 72 year old female with past medical history of bilateral hearing loss, tinnitus, vertigo and voice change ~ 5 months prior. Pt also reports globus sensation when eating. Past mention of esophageal dysphagia by Dr Tillman Abide during pt's appt on 12/11/20215.  Gets food stuck in throat at times Once a month--no particular food  No heartburn or water brash--but does get epigastric pain at times. TUMs doesn't relieve. 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   PAIN:  Are you having pain? No   FALLS: Has patient fallen in last 6 months? No,   LIVING ENVIRONMENT: Lives with: lives with their spouse Lives in: House/apartment  PLOF: Independent  PATIENT GOALS    to get her voice back  SUBJECTIVE STATEMENT: I had 2 people tell me my voice was sounding stronger Pt accompanied by: self  OBJECTIVE:   TODAY'S TREATMENT: Skilled treatment session focused on pt's dysphonia goals. SLP facilitated session by providing the following interventions:  Extensive education provided that Neck range of motion exercises should be done to the point of feeling a  GENTLE, TOLERABLE stretch only. Demonstration provided with pt able to imitate for the following stretches:  Head Tilt: Forward and Back - Gently bow your head and try to touch your chin to your chest. Raise your chin back to the starting position. Tilt your head back as far as possible so you are looking up at the ceiling. Return your head to the starting position. -  INDEPENDENT  Head Tilt: Side to Side: Tilt your head to the side, bringing your ear toward your shoulder. Do not  raise your shoulder to your ear. Keep your shoulder still. Return your head to the starting position. INDEPENDENT  Head turns: Turn your head to look over your shoulder. Tilt your chin down and try to touch it to your shoulder. Do not raise your shoulder to your chin. Face forward again -INDEPENDENT  When reading out-loud pt with progressive tension/strain as phonation progressed. When broken into 2-3 sentences at a time with focus on relaxed forward voicing, pt able to decrease strain   PATIENT EDUCATION: Education details: see above Person educated: Patient Education method: Explanation Education comprehension: needs further education   HOME EXERCISE PROGRAM: Abdominal breathing Use of pectin based throat lozenge Neck stretches Flow phonation thru voiced+articulated phrases - break oral reading into 3-4 sentences with forward focus   GOALS: Goals reviewed with patient? Yes  SHORT TERM GOALS: Target date: 10 sessions  The patient will decrease laryngeal and articulatory muscle tension by independently completing relaxation/stretching exercises.  Baseline: Goal status: INITIAL  2.  The patient will minimize vocal tension via resonant voice therapy (or comparable technique) with min SLP cues with 80% accuracy.   Baseline:  Goal status: INITIAL  3.  The patient will demonstrate abdominal breathing patterns and steady release of breath on exhalation to optimize efficiency of voicing and decrease laryngeal hyperfunction.   Baseline:  Goal status: INITIAL   LONG TERM GOALS: Target date: 12/09/2022  The patient will maintain relaxed phonation for paragraph length recitation with 80% accuracy.  Baseline:  Goal status: INITIAL  2.  The patient will be independent for abdominal breathing and breath support exercises.   Baseline:  Goal status: INITIAL  3.  The patient will demonstrate independent understanding of vocal hygiene concepts.   Baseline:  Goal status:  INITIAL  4.  Patient will report improved communication effectiveness as measured by PROM   Baseline:  Goal status: INITIAL   ASSESSMENT:  CLINICAL IMPRESSION: Patient is a 72 y.o. female who was seen today for voice therapy d/t mild dysphonia that is c/b strain, harsh, raspy vocal quality and intermittent pitch breaks. She was also observed speaking on residual respiratory support.Pt with much improvement throughout today's session, increased ability to produce relaxed voicing observed.  See the above treatment note for details.   OBJECTIVE IMPAIRMENTS include voice disorder. These impairments are limiting patient from effectively communicating at home and in community and safety when swallowing. Factors affecting potential to achieve goals and functional outcome are  unknown etiology and time post onset . Patient will benefit from skilled SLP services to address above impairments and improve overall function.  REHAB POTENTIAL: Good  PLAN: SLP FREQUENCY: 1-2x/week  SLP DURATION: 8 weeks  PLANNED INTERVENTIONS: SLP instruction and feedback and Patient/family education   Carmel Garfield B. Dreama Saa, M.S., CCC-SLP, Tree surgeon Certified Brain Injury Specialist [facility]  [facility] Rehabilitation Services Office [phone] Ascom [phone] Fax [phone]

## 2022-11-10 ENCOUNTER — Ambulatory Visit: Payer: Medicare Other | Admitting: Speech Pathology

## 2022-11-10 DIAGNOSIS — R490 Dysphonia: Secondary | ICD-10-CM

## 2022-11-10 NOTE — Therapy (Signed)
OUTPATIENT SPEECH LANGUAGE PATHOLOGY  TREATMENT NOTE   Patient Name: Isabel Johnson MRN: [id number] DOB:Aug 14, 1950, 72 y.o., female Today's Date: 11/10/2022  PCP: Einar Crow, MD REFERRING PROVIDER: Bud Face, MD   End of Session - 11/10/22 1320     Visit Number 7    Number of Visits 17    Date for SLP Re-Evaluation 12/09/22    Authorization Type Medicare A/Medicare B    Progress Note Due on Visit 10    SLP Start Time 1315    SLP Stop Time  1400    SLP Time Calculation (min) 45 min    Activity Tolerance Patient tolerated treatment well             Past Medical History:  Diagnosis Date   Actinic keratosis    Basal cell carcinoma 07/07/2022   Left upper back at base of neck. Superficial and nodular. Excision 08/04/22   Dysplastic nevus 07/07/2022   Left medial knee. Severe atypia, irritated. Close to margin. Excised 07/21/22, margins free   Endometrial polyp    Endometriosis    Hyperlipidemia    PMB (postmenopausal bleeding)    PONV (postoperative nausea and vomiting)    Squamous cell carcinoma of skin 04/06/2022   Left distal dorsal forearm. KA-type. EDC   Type 2 diabetes mellitus (HCC)    Wears contact lenses    Past Surgical History:  Procedure Laterality Date   BREAST BIOPSY Right 08/15/2020   Stereo Bx, x-clip, BENIGN BREAST TISSUE, MOSTLY FATTY, WITH USUAL DUCTAL HYPERPLASIA AND CYSTS. - NEGATIVE FOR ATYPIA AND MALIGNANCY.   DILATATION & CURETTAGE/HYSTEROSCOPY WITH MYOSURE N/A 03/17/2015   Procedure: DILATATION & CURETTAGE/HYSTEROSCOPY WITH MYOSURE;  Surgeon: Richardean Chimera, MD;  Location: [facility] [place];  Service: Gynecology;  Laterality: N/A;   LAPAROSCOPY  1980   endometriosis   Patient Active Problem List   Diagnosis Date Noted   Diabetes mellitus without complication (HCC)    Esophageal dysphagia 03/08/2014   Routine general medical examination at a health care facility 12/24/2010   ENDOMETRIOSIS NOS 07/01/2006    ONSET DATE:   reports symptoms started 5 months ago; 09/28/2022 date of referral  REFERRING DIAG: Dysphonia (R49.0)  THERAPY DIAG:  Dysphonia  Rationale for Evaluation and Treatment Rehabilitation  SUBJECTIVE:   PERTINENT HISTORY: Pt is a 72 year old female with past medical history of bilateral hearing loss, tinnitus, vertigo and voice change ~ 5 months prior. Pt also reports globus sensation when eating. Past mention of esophageal dysphagia by Dr Tillman Abide during pt's appt on 12/11/20215.  Gets food stuck in throat at times Once a month--no particular food  No heartburn or water brash--but does get epigastric pain at times. TUMs doesn't relieve. 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   PAIN:  Are you having pain? No   FALLS: Has patient fallen in last 6 months? No,   LIVING ENVIRONMENT: Lives with: lives with their spouse Lives in: House/apartment  PLOF: Independent  PATIENT GOALS    to get her voice back  SUBJECTIVE STATEMENT: I had 2 people tell me my voice was sounding stronger Pt accompanied by: self  OBJECTIVE:   TODAY'S TREATMENT: Skilled treatment session focused on pt's dysphonia goals. SLP facilitated session by providing the following interventions:  Pt arrives with increased vocal strain over previous sessions. She reports that she has been talking a lot  yesterday and today as well as rushing. As she performed these activities, she noticed that her vocal quality became more strained and she began experiencing vocal soreness. She also admits that she hasn't been mindful of vocal rest. SLP presented ways such as easy onset and resonant voicing to aid in relaxed voicing. With cues, pt able to implement in session to achieve intermittent relaxed voicing.    PATIENT EDUCATION: Education details: see above Person educated:  Patient Education method: Explanation Education comprehension: needs further education   HOME EXERCISE PROGRAM: Abdominal breathing Use of pectin based throat lozenge Neck stretches Flow phonation thru voiced+articulated phrases - break oral reading into 3-4 sentences with forward focus   GOALS: Goals reviewed with patient? Yes  SHORT TERM GOALS: Target date: 10 sessions  The patient will decrease laryngeal and articulatory muscle tension by independently completing relaxation/stretching exercises.  Baseline: Goal status: INITIAL  2.  The patient will minimize vocal tension via resonant voice therapy (or comparable technique) with min SLP cues with 80% accuracy.   Baseline:  Goal status: INITIAL  3.  The patient will demonstrate abdominal breathing patterns and steady release of breath on exhalation to optimize efficiency of voicing and decrease laryngeal hyperfunction.   Baseline:  Goal status: INITIAL   LONG TERM GOALS: Target date: 12/09/2022  The patient will maintain relaxed phonation for paragraph length recitation with 80% accuracy.  Baseline:  Goal status: INITIAL  2.  The patient will be independent for abdominal breathing and breath support exercises.   Baseline:  Goal status: INITIAL  3.  The patient will demonstrate independent understanding of vocal hygiene concepts.   Baseline:  Goal status: INITIAL  4.  Patient will report improved communication effectiveness as measured by PROM   Baseline:  Goal status: INITIAL   ASSESSMENT:  CLINICAL IMPRESSION: Patient is a 72 y.o. female who was seen today for voice therapy d/t mild dysphonia that is c/b strain, harsh, raspy vocal quality and intermittent pitch breaks. She was also observed speaking on residual respiratory support. Pt with more strained vocal quality suspect d/t level of stress pt reports (rushing to do things). She is able to achieve relaxed voicing when she slows her speech and uses easy  onset. See the above treatment note for details.   OBJECTIVE IMPAIRMENTS include voice disorder. These impairments are limiting patient from effectively communicating at home and in community and safety when swallowing. Factors affecting potential to achieve goals and functional outcome are  unknown etiology and time post onset . Patient will benefit from skilled SLP services to address above impairments and improve overall function.  REHAB POTENTIAL: Good  PLAN: SLP FREQUENCY: 1-2x/week  SLP DURATION: 8 weeks  PLANNED INTERVENTIONS: SLP instruction and feedback and Patient/family education   Bradford Cazier B. Dreama Saa, M.S., CCC-SLP, Tree surgeon Certified Brain Injury Specialist [facility]  [facility] Rehabilitation Services Office [phone] Ascom [phone] Fax [phone]

## 2022-11-15 ENCOUNTER — Ambulatory Visit: Payer: Medicare Other | Admitting: Speech Pathology

## 2022-11-15 DIAGNOSIS — R490 Dysphonia: Secondary | ICD-10-CM | POA: Diagnosis not present

## 2022-11-15 NOTE — Therapy (Signed)
OUTPATIENT SPEECH LANGUAGE PATHOLOGY  TREATMENT NOTE   Patient Name: Isabel Johnson MRN: [id number] DOB:Aug 18, 1950, 72 y.o., female Today's Date: 11/15/2022  PCP: Einar Crow, MD REFERRING PROVIDER: Bud Face, MD   End of Session - 11/15/22 1449     Visit Number 8    Number of Visits 17    Date for SLP Re-Evaluation 12/09/22    Authorization Type Medicare A/Medicare B    Progress Note Due on Visit 10    SLP Start Time 1445    SLP Stop Time  1515    SLP Time Calculation (min) 30 min    Activity Tolerance Patient tolerated treatment well             Past Medical History:  Diagnosis Date   Actinic keratosis    Basal cell carcinoma 07/07/2022   Left upper back at base of neck. Superficial and nodular. Excision 08/04/22   Dysplastic nevus 07/07/2022   Left medial knee. Severe atypia, irritated. Close to margin. Excised 07/21/22, margins free   Endometrial polyp    Endometriosis    Hyperlipidemia    PMB (postmenopausal bleeding)    PONV (postoperative nausea and vomiting)    Squamous cell carcinoma of skin 04/06/2022   Left distal dorsal forearm. KA-type. EDC   Type 2 diabetes mellitus (HCC)    Wears contact lenses    Past Surgical History:  Procedure Laterality Date   BREAST BIOPSY Right 08/15/2020   Stereo Bx, x-clip, BENIGN BREAST TISSUE, MOSTLY FATTY, WITH USUAL DUCTAL HYPERPLASIA AND CYSTS. - NEGATIVE FOR ATYPIA AND MALIGNANCY.   DILATATION & CURETTAGE/HYSTEROSCOPY WITH MYOSURE N/A 03/17/2015   Procedure: DILATATION & CURETTAGE/HYSTEROSCOPY WITH MYOSURE;  Surgeon: Richardean Chimera, MD;  Location: [facility] [place];  Service: Gynecology;  Laterality: N/A;   LAPAROSCOPY  1980   endometriosis   Patient Active Problem List   Diagnosis Date Noted   Diabetes mellitus without complication (HCC)    Esophageal dysphagia 03/08/2014   Routine general medical examination at a health care facility 12/24/2010   ENDOMETRIOSIS NOS 07/01/2006    ONSET DATE:   reports symptoms started 5 months ago; 09/28/2022 date of referral  REFERRING DIAG: Dysphonia (R49.0)  THERAPY DIAG:  Dysphonia  Rationale for Evaluation and Treatment Rehabilitation  SUBJECTIVE:   PERTINENT HISTORY: Pt is a 72 year old female with past medical history of bilateral hearing loss, tinnitus, vertigo and voice change ~ 5 months prior. Pt also reports globus sensation when eating. Past mention of esophageal dysphagia by Dr Tillman Abide during pt's appt on 12/11/20215.  Gets food stuck in throat at times Once a month--no particular food  No heartburn or water brash--but does get epigastric pain at times. TUMs doesn't relieve. 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   PAIN:  Are you having pain? No   FALLS: Has patient fallen in last 6 months? No,   LIVING ENVIRONMENT: Lives with: lives with their spouse Lives in: House/apartment  PLOF: Independent  PATIENT GOALS    to get her voice back  SUBJECTIVE STATEMENT: "My husband has said that my voice sounds more like me more often" Pt accompanied by: self  OBJECTIVE:   TODAY'S TREATMENT: Skilled treatment session focused on pt's dysphonia goals. SLP facilitated session by providing the following interventions:  Pt arrived with improved vocal quality c/b relaxed voicing, appropriate pitch (201 Hz) and improved respiratory  support for phonation. Pt states that she is learning to take more vocal rest. Pt was Mod I for use of relaxed voicing for ~ 35 minutes of continuous conversation during today's session.    PATIENT EDUCATION: Education details: see above Person educated: Patient Education method: Explanation Education comprehension: needs further education   HOME EXERCISE PROGRAM: Abdominal breathing Use of pectin based throat lozenge Neck stretches Flow phonation thru  voiced+articulated phrases - break oral reading into 3-4 sentences with forward focus   GOALS: Goals reviewed with patient? Yes  SHORT TERM GOALS: Target date: 10 sessions  The patient will decrease laryngeal and articulatory muscle tension by independently completing relaxation/stretching exercises.  Baseline: Goal status: INITIAL  2.  The patient will minimize vocal tension via resonant voice therapy (or comparable technique) with min SLP cues with 80% accuracy.   Baseline:  Goal status: INITIAL  3.  The patient will demonstrate abdominal breathing patterns and steady release of breath on exhalation to optimize efficiency of voicing and decrease laryngeal hyperfunction.   Baseline:  Goal status: INITIAL   LONG TERM GOALS: Target date: 12/09/2022  The patient will maintain relaxed phonation for paragraph length recitation with 80% accuracy.  Baseline:  Goal status: INITIAL  2.  The patient will be independent for abdominal breathing and breath support exercises.   Baseline:  Goal status: INITIAL  3.  The patient will demonstrate independent understanding of vocal hygiene concepts.   Baseline:  Goal status: INITIAL  4.  Patient will report improved communication effectiveness as measured by PROM   Baseline:  Goal status: INITIAL   ASSESSMENT:  CLINICAL IMPRESSION: Patient is a 72 y.o. female who was seen today for voice therapy d/t mild dysphonia that is c/b strain, harsh, raspy vocal quality and intermittent pitch breaks. She was also observed speaking on residual respiratory support. Pt with improved vocal quality throughout today's session.  See the above treatment note for details.   OBJECTIVE IMPAIRMENTS include voice disorder. These impairments are limiting patient from effectively communicating at home and in community and safety when swallowing. Factors affecting potential to achieve goals and functional outcome are  unknown etiology and time post onset .  Patient will benefit from skilled SLP services to address above impairments and improve overall function.  REHAB POTENTIAL: Good  PLAN: SLP FREQUENCY: 1-2x/week  SLP DURATION: 8 weeks  PLANNED INTERVENTIONS: SLP instruction and feedback and Patient/family education   Gerhard Rappaport B. Dreama Saa, M.S., CCC-SLP, Tree surgeon Certified Brain Injury Specialist [facility]  [facility] Rehabilitation Services Office [phone] Ascom [phone] Fax [phone]

## 2022-11-17 ENCOUNTER — Ambulatory Visit: Payer: Medicare Other | Admitting: Speech Pathology

## 2022-11-17 DIAGNOSIS — R490 Dysphonia: Secondary | ICD-10-CM | POA: Diagnosis not present

## 2022-11-17 NOTE — Therapy (Signed)
OUTPATIENT SPEECH LANGUAGE PATHOLOGY  TREATMENT NOTE   Patient Name: Isabel Johnson MRN: [id number] DOB:18-Feb-1951, 72 y.o., female Today's Date: 11/17/2022  PCP: Einar Crow, MD REFERRING PROVIDER: Bud Face, MD   End of Session - 11/17/22 1314     Visit Number 9    Number of Visits 17    Date for SLP Re-Evaluation 12/09/22    Authorization Type Medicare A/Medicare B    Progress Note Due on Visit 10    SLP Start Time 1315    SLP Stop Time  1400    SLP Time Calculation (min) 45 min    Activity Tolerance Patient tolerated treatment well             Past Medical History:  Diagnosis Date   Actinic keratosis    Basal cell carcinoma 07/07/2022   Left upper back at base of neck. Superficial and nodular. Excision 08/04/22   Dysplastic nevus 07/07/2022   Left medial knee. Severe atypia, irritated. Close to margin. Excised 07/21/22, margins free   Endometrial polyp    Endometriosis    Hyperlipidemia    PMB (postmenopausal bleeding)    PONV (postoperative nausea and vomiting)    Squamous cell carcinoma of skin 04/06/2022   Left distal dorsal forearm. KA-type. EDC   Type 2 diabetes mellitus (HCC)    Wears contact lenses    Past Surgical History:  Procedure Laterality Date   BREAST BIOPSY Right 08/15/2020   Stereo Bx, x-clip, BENIGN BREAST TISSUE, MOSTLY FATTY, WITH USUAL DUCTAL HYPERPLASIA AND CYSTS. - NEGATIVE FOR ATYPIA AND MALIGNANCY.   DILATATION & CURETTAGE/HYSTEROSCOPY WITH MYOSURE N/A 03/17/2015   Procedure: DILATATION & CURETTAGE/HYSTEROSCOPY WITH MYOSURE;  Surgeon: Richardean Chimera, MD;  Location: [facility];  Service: Gynecology;  Laterality: N/A;   LAPAROSCOPY  1980   endometriosis   Patient Active Problem List   Diagnosis Date Noted   Diabetes mellitus without complication (HCC)    Esophageal dysphagia 03/08/2014   Routine general medical examination at a health care facility 12/24/2010   ENDOMETRIOSIS NOS 07/01/2006    ONSET DATE:   reports symptoms started 5 months ago; 09/28/2022 date of referral  REFERRING DIAG: Dysphonia (R49.0)  THERAPY DIAG:  Dysphonia  Rationale for Evaluation and Treatment Rehabilitation  SUBJECTIVE:   PERTINENT HISTORY: Pt is a 72 year old female with past medical history of bilateral hearing loss, tinnitus, vertigo and voice change ~ 5 months prior. Pt also reports globus sensation when eating. Past mention of esophageal dysphagia by Dr Tillman Abide during pt's appt on 12/11/20215.  Gets food stuck in throat at times Once a month--no particular food  No heartburn or water brash--but does get epigastric pain at times. TUMs doesn't relieve. 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   PAIN:  Are you having pain? No   FALLS: Has patient fallen in last 6 months? No,   LIVING ENVIRONMENT: Lives with: lives with their spouse Lives in: House/apartment  PLOF: Independent  PATIENT GOALS    to get her voice back  SUBJECTIVE STATEMENT: Pt reports being more diligent about vocal rest Pt accompanied by: self  OBJECTIVE:   TODAY'S TREATMENT: Skilled treatment session focused on pt's dysphonia goals. SLP facilitated session by providing the following interventions:  Pt arrived with improved vocal quality c/b relaxed voicing, appropriate pitch (201 Hz) and improved respiratory support for phonation. Pt states  that she is learning to take more vocal rest. Pt was IND for use of relaxed voicing for ~ 35 minutes of continuous conversation during today's session.    PATIENT EDUCATION: Education details: see above Person educated: Patient Education method: Explanation Education comprehension: needs further education   HOME EXERCISE PROGRAM: Abdominal breathing Use of pectin based throat lozenge Neck stretches Flow phonation thru voiced+articulated  phrases - break oral reading into 3-4 sentences with forward focus   GOALS: Goals reviewed with patient? Yes  SHORT TERM GOALS: Target date: 10 sessions  The patient will decrease laryngeal and articulatory muscle tension by independently completing relaxation/stretching exercises.  Baseline: Goal status: INITIAL  2.  The patient will minimize vocal tension via resonant voice therapy (or comparable technique) with min SLP cues with 80% accuracy.   Baseline:  Goal status: INITIAL  3.  The patient will demonstrate abdominal breathing patterns and steady release of breath on exhalation to optimize efficiency of voicing and decrease laryngeal hyperfunction.   Baseline:  Goal status: INITIAL   LONG TERM GOALS: Target date: 12/09/2022  The patient will maintain relaxed phonation for paragraph length recitation with 80% accuracy.  Baseline:  Goal status: INITIAL  2.  The patient will be independent for abdominal breathing and breath support exercises.   Baseline:  Goal status: INITIAL  3.  The patient will demonstrate independent understanding of vocal hygiene concepts.   Baseline:  Goal status: INITIAL  4.  Patient will report improved communication effectiveness as measured by PROM   Baseline:  Goal status: INITIAL   ASSESSMENT:  CLINICAL IMPRESSION: Patient is a 72 y.o. female who was seen today for voice therapy d/t mild dysphonia that is c/b strain, harsh, raspy vocal quality and intermittent pitch breaks. She was also observed speaking on residual respiratory support. Pt with improved vocal quality throughout today's session for longer sustained periods of time.  See the above treatment note for details.   OBJECTIVE IMPAIRMENTS include voice disorder. These impairments are limiting patient from effectively communicating at home and in community and safety when swallowing. Factors affecting potential to achieve goals and functional outcome are  unknown etiology and  time post onset . Patient will benefit from skilled SLP services to address above impairments and improve overall function.  REHAB POTENTIAL: Good  PLAN: SLP FREQUENCY: 1-2x/week  SLP DURATION: 8 weeks  PLANNED INTERVENTIONS: SLP instruction and feedback and Patient/family education   Ramanda Paules B. Dreama Saa, M.S., CCC-SLP, Tree surgeon Certified Brain Injury Specialist [facility]  [facility] Rehabilitation Services Office [phone] Ascom [phone] Fax [phone]

## 2022-11-22 ENCOUNTER — Ambulatory Visit: Payer: Medicare Other | Admitting: Speech Pathology

## 2022-11-22 DIAGNOSIS — R490 Dysphonia: Secondary | ICD-10-CM | POA: Diagnosis not present

## 2022-11-22 NOTE — Therapy (Signed)
OUTPATIENT SPEECH LANGUAGE PATHOLOGY  TREATMENT NOTE DISCHARGE NOTE   Patient Name: Isabel Johnson MRN: [id number] DOB:Sep 10, 1950, 72 y.o., female Today's Date: 11/22/2022  PCP: Einar Crow, MD REFERRING PROVIDER: Bud Face, MD   End of Session - 11/22/22 1345     Visit Number 10    Number of Visits 17    Date for SLP Re-Evaluation 12/09/22    Authorization Type Medicare A/Medicare B    Progress Note Due on Visit 10    SLP Start Time 1315    SLP Stop Time  1345    SLP Time Calculation (min) 30 min    Activity Tolerance Patient tolerated treatment well             Past Medical History:  Diagnosis Date   Actinic keratosis    Basal cell carcinoma 07/07/2022   Left upper back at base of neck. Superficial and nodular. Excision 08/04/22   Dysplastic nevus 07/07/2022   Left medial knee. Severe atypia, irritated. Close to margin. Excised 07/21/22, margins free   Endometrial polyp    Endometriosis    Hyperlipidemia    PMB (postmenopausal bleeding)    PONV (postoperative nausea and vomiting)    Squamous cell carcinoma of skin 04/06/2022   Left distal dorsal forearm. KA-type. EDC   Type 2 diabetes mellitus (HCC)    Wears contact lenses    Past Surgical History:  Procedure Laterality Date   BREAST BIOPSY Right 08/15/2020   Stereo Bx, x-clip, BENIGN BREAST TISSUE, MOSTLY FATTY, WITH USUAL DUCTAL HYPERPLASIA AND CYSTS. - NEGATIVE FOR ATYPIA AND MALIGNANCY.   DILATATION & CURETTAGE/HYSTEROSCOPY WITH MYOSURE N/A 03/17/2015   Procedure: DILATATION & CURETTAGE/HYSTEROSCOPY WITH MYOSURE;  Surgeon: Richardean Chimera, MD;  Location: [facility];  Service: Gynecology;  Laterality: N/A;   LAPAROSCOPY  1980   endometriosis   Patient Active Problem List   Diagnosis Date Noted   Diabetes mellitus without complication (HCC)    Esophageal dysphagia 03/08/2014   Routine general medical examination at a health care facility 12/24/2010   ENDOMETRIOSIS NOS 07/01/2006     ONSET DATE:  reports symptoms started 5 months ago; 09/28/2022 date of referral  REFERRING DIAG: Dysphonia (R49.0)  THERAPY DIAG:  Dysphonia  Rationale for Evaluation and Treatment Rehabilitation  SUBJECTIVE:   PERTINENT HISTORY: Pt is a 72 year old female with past medical history of bilateral hearing loss, tinnitus, vertigo and voice change ~ 5 months prior. Pt also reports globus sensation when eating. Past mention of esophageal dysphagia by Dr Tillman Abide during pt's appt on 12/11/20215.  Gets food stuck in throat at times Once a month--no particular food  No heartburn or water brash--but does get epigastric pain at times. TUMs doesn't relieve. 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   PAIN:  Are you having pain? No   FALLS: Has patient fallen in last 6 months? No,   LIVING ENVIRONMENT: Lives with: lives with their spouse Lives in: House/apartment  PLOF: Independent  PATIENT GOALS    to get her voice back  SUBJECTIVE STATEMENT: Pt reports being more diligent about vocal rest Pt accompanied by: self  OBJECTIVE:   TODAY'S TREATMENT: Skilled treatment session focused on pt's dysphonia goals. SLP facilitated session by providing the following interventions:  Pt arrived with improved vocal quality c/b relaxed voicing, appropriate pitch (222 Hz) and improved respiratory support for phonation.  Pt states that she is learning to take more vocal rest. Pt was IND for use of relaxed voicing for ~ 35 minutes of continuous conversation during today's session.    VOICE HANDICAP INDEX (VHI)  The Voice Handicap Index is comprised of a series of questions to assess the patient's perception of their voice. It is designed to evaluate the emotional, physical and functional components of the voice problem.  Functional: 12 Physical:  22 Emotional: 5 Total: 39 (Normal mean 8.75, SD =14.97)  z score =  2.0  moderate = 2.00-2.99   PATIENT EDUCATION: Education details: see above Person educated: Patient Education method: Explanation Education comprehension: needs further education   HOME EXERCISE PROGRAM: Abdominal breathing Use of pectin based throat lozenge Neck stretches Flow phonation thru voiced+articulated phrases - break oral reading into 3-4 sentences with forward focus   GOALS: Goals reviewed with patient? Yes  SHORT TERM GOALS: Target date: 10 sessions  The patient will decrease laryngeal and articulatory muscle tension by independently completing relaxation/stretching exercises.  Baseline: Goal status: INITIAL: met  2.  The patient will minimize vocal tension via resonant voice therapy (or comparable technique) with min SLP cues with 80% accuracy.   Baseline:  Goal status: INITIAL:MET  3.  The patient will demonstrate abdominal breathing patterns and steady release of breath on exhalation to optimize efficiency of voicing and decrease laryngeal hyperfunction.   Baseline:  Goal status: INITIAL:MET   LONG TERM GOALS: Target date: 12/09/2022  The patient will maintain relaxed phonation for paragraph length recitation with 80% accuracy.  Baseline:  Goal status: INITIAL: MET  2.  The patient will be independent for abdominal breathing and breath support exercises.   Baseline:  Goal status: INITIAL: MET  3.  The patient will demonstrate independent understanding of vocal hygiene concepts.   Baseline:  Goal status: INITIAL: MET  4.  Patient will report improved communication effectiveness as measured by PROM   Baseline:  Goal status: INITIAL: MET   ASSESSMENT:  CLINICAL IMPRESSION: Pt presents with improved dysphonia with improved vocal quality that is no longer harsh or strained. Pt does continue with some breathy voicing as well as difficulty projecting her voice that is likely  related to glottal gap as observed on laryngoscopy. At this time, pt has met maximal benefit from skilled ST services and will seek re-evaluation by ENT for potential treatment of glottal gap. See the above treatment note for details.   OBJECTIVE IMPAIRMENTS include voice disorder. These impairments are limiting patient from effectively communicating at home and in community and safety when swallowing. Factors affecting potential to achieve goals and functional outcome are  unknown etiology and time post onset . Patient will benefit from skilled SLP services to address above impairments and improve overall function.  REHAB POTENTIAL: Good  PLAN: SLP FREQUENCY: 1-2x/week  SLP DURATION: 8 weeks  PLANNED INTERVENTIONS: SLP instruction and feedback and Patient/family education   Kelley Knoth B. Dreama Saa, M.S., CCC-SLP, Tree surgeon Certified Brain Injury Specialist [facility]  [facility] Rehabilitation Services Office [phone] Ascom [phone] Fax [phone]

## 2022-11-24 ENCOUNTER — Ambulatory Visit: Payer: Medicare Other | Admitting: Speech Pathology

## 2022-11-30 ENCOUNTER — Encounter: Payer: Medicare Other | Admitting: Speech Pathology

## 2022-12-06 ENCOUNTER — Encounter: Payer: Medicare Other | Admitting: Speech Pathology

## 2022-12-08 ENCOUNTER — Encounter: Payer: Medicare Other | Admitting: Speech Pathology

## 2022-12-13 ENCOUNTER — Encounter: Payer: Medicare Other | Admitting: Speech Pathology

## 2022-12-15 ENCOUNTER — Encounter: Payer: Medicare Other | Admitting: Speech Pathology

## 2022-12-20 ENCOUNTER — Encounter: Payer: Medicare Other | Admitting: Speech Pathology

## 2022-12-22 ENCOUNTER — Encounter: Payer: Medicare Other | Admitting: Speech Pathology

## 2022-12-27 ENCOUNTER — Encounter: Payer: Medicare Other | Admitting: Speech Pathology

## 2022-12-29 ENCOUNTER — Encounter: Payer: Medicare Other | Admitting: Speech Pathology

## 2023-01-03 ENCOUNTER — Encounter: Payer: Medicare Other | Admitting: Speech Pathology

## 2023-01-04 ENCOUNTER — Encounter: Payer: Self-pay | Admitting: Dermatology

## 2023-01-04 ENCOUNTER — Ambulatory Visit (INDEPENDENT_AMBULATORY_CARE_PROVIDER_SITE_OTHER): Payer: Medicare Other | Admitting: Dermatology

## 2023-01-04 VITALS — BP 111/55 | HR 67

## 2023-01-04 DIAGNOSIS — L821 Other seborrheic keratosis: Secondary | ICD-10-CM

## 2023-01-04 DIAGNOSIS — D1801 Hemangioma of skin and subcutaneous tissue: Secondary | ICD-10-CM

## 2023-01-04 DIAGNOSIS — Z85828 Personal history of other malignant neoplasm of skin: Secondary | ICD-10-CM

## 2023-01-04 DIAGNOSIS — Z1283 Encounter for screening for malignant neoplasm of skin: Secondary | ICD-10-CM

## 2023-01-04 DIAGNOSIS — L814 Other melanin hyperpigmentation: Secondary | ICD-10-CM

## 2023-01-04 DIAGNOSIS — L578 Other skin changes due to chronic exposure to nonionizing radiation: Secondary | ICD-10-CM | POA: Diagnosis not present

## 2023-01-04 DIAGNOSIS — W908XXA Exposure to other nonionizing radiation, initial encounter: Secondary | ICD-10-CM | POA: Diagnosis not present

## 2023-01-04 DIAGNOSIS — L719 Rosacea, unspecified: Secondary | ICD-10-CM

## 2023-01-04 DIAGNOSIS — D229 Melanocytic nevi, unspecified: Secondary | ICD-10-CM

## 2023-01-04 DIAGNOSIS — Z86018 Personal history of other benign neoplasm: Secondary | ICD-10-CM

## 2023-01-04 MED ORDER — METRONIDAZOLE 0.75 % EX CREA: TOPICAL_CREAM | CUTANEOUS | 11 refills | Status: DC

## 2023-01-04 NOTE — Patient Instructions (Addendum)
Rosacea  What is rosacea? Rosacea (say: ro-zay-sha) is a common skin disease that usually begins as a trend of flushing or blushing easily.  As rosacea progresses, a persistent redness in the center of the face will develop and may gradually spread beyond the nose and cheeks to the forehead and chin.  In some cases, the ears, chest, and back could be affected.  Rosacea may appear as tiny blood vessels or small red bumps that occur in crops.  Frequently they can contain pus, and are called "pustules".  If the bumps do not contain pus, they are referred to as "papules".  Rarely, in prolonged, untreated cases of rosacea, the oil glands of the nose and cheeks may become permanently enlarged.  This is called rhinophyma, and is seen more frequently in men.  Signs and Risks In its beginning stages, rosacea tends to come and go, which makes it difficult to recognize.  It can start as intermittent flushing of the face.  Eventually, blood vessels may become permanently visible.  Pustules and papules can appear, but can be mistaken for adult acne.  People of all races, ages, genders and ethnic groups are at risk of developing rosacea.  However, it is more common in women (especially around menopause) and adults with fair skin between the ages of 62 and 61.  Treatment Dermatologists typically recommend a combination of treatments to effectively manage rosacea.  Treatment can improve symptoms and may stop the progression of the rosacea.  Treatment may involve both topical and oral medications.  The tetracycline antibiotics are often used for their anti-inflammatory effect; however, because of the possibility of developing antibiotic resistance, they should not be used long term at full dose.  For dilated blood vessels the options include electrodessication (uses electric current through a small needle), laser treatment, and cosmetics to hide the redness.   With all forms of treatment, improvement is a slow process, and  patients may not see any results for the first 3-4 weeks.  It is very important to avoid the sun and other triggers.  Patients must wear sunscreen daily.  Skin Care Instructions: Cleanse the skin with a mild soap such as CeraVe cleanser, Cetaphil cleanser, or Dove soap once or twice daily as needed. Moisturize with Eucerin Redness Relief Daily Perfecting Lotion (has a subtle green tint), CeraVe Moisturizing Cream, or Oil of Olay Daily Moisturizer with sunscreen every morning and/or night as recommended. Makeup should be "non-comedogenic" (won't clog pores) and be labeled "for sensitive skin". Good choices for cosmetics are: Neutrogena, Almay, and Physician's Formula.  Any product with a green tint tends to offset a red complexion. If your eyes are dry and irritated, use artificial tears 2-3 times per day and cleanse the eyelids daily with baby shampoo.  Have your eyes examined at least every 2 years.  Be sure to tell your eye doctor that you have rosacea. Alcoholic beverages tend to cause flushing of the skin, and may make rosacea worse. Always wear sunscreen, protect your skin from extreme hot and cold temperatures, and avoid spicy foods, hot drinks, and mechanical irritation such as rubbing, scrubbing, or massaging the face.  Avoid harsh skin cleansers, cleansing masks, astringents, and exfoliation. If a particular product burns or makes your face feel tight, then it is likely to flare your rosacea. If you are having difficulty finding a sunscreen that you can tolerate, you may try switching to a chemical-free sunscreen.  These are ones whose active ingredient is zinc oxide or titanium dioxide  only.  They should also be fragrance free, non-comedogenic, and labeled for sensitive skin. Rosacea triggers may vary from person to person.  There are a variety of foods that have been reported to trigger rosacea.  Some patients find that keeping a diary of what they were doing when they flared helps them avoid  triggers.    Gentle Skin Care Guide  1. Bathe no more than once a day.  2. Avoid bathing in hot water  3. Use a mild soap like Dove, Vanicream, Cetaphil, CeraVe. Can use Lever 2000 or Cetaphil antibacterial soap  4. Use soap only where you need it. On most days, use it under your arms, between your legs, and on your feet. Let the water rinse other areas unless visibly dirty.  5. When you get out of the bath/shower, use a towel to gently blot your skin dry, don't rub it.  6. While your skin is still a little damp, apply a moisturizing cream such as Vanicream, CeraVe, Cetaphil, Eucerin, Sarna lotion or plain Vaseline Jelly. For hands apply Neutrogena [place] Hand Cream or Excipial Hand Cream.  7. Reapply moisturizer any time you start to itch or feel dry.  8. Sometimes using free and clear laundry detergents can be helpful. Fabric softener sheets should be avoided. Downy Free & Gentle liquid, or any liquid fabric softener that is free of dyes and perfumes, it acceptable to use  9. If your doctor has given you prescription creams you may apply moisturizers over them    Due to recent changes in healthcare laws, you may see results of your pathology and/or laboratory studies on MyChart before the doctors have had a chance to review them. We understand that in some cases there may be results that are confusing or concerning to you. Please understand that not all results are received at the same time and often the doctors may need to interpret multiple results in order to provide you with the best plan of care or course of treatment. Therefore, we ask that you please give [place] 2 business days to thoroughly review all your results before contacting the office for clarification. Should we see a critical lab result, you will be contacted sooner.   If You Need Anything After Your Visit  If you have any questions or concerns for your doctor, please call our main line at [phone] and press  option 4 to reach your doctor's medical assistant. If no one answers, please leave a voicemail as directed and we will return your call as soon as possible. Messages left after 4 pm will be answered the following business day.   You may also send [place] a message via MyChart. We typically respond to MyChart messages within 1-2 business days.  For prescription refills, please ask your pharmacy to contact our office. Our fax number is [phone].  If you have an urgent issue when the clinic is closed that cannot wait until the next business day, you can page your doctor at the number below.    Please note that while we do our best to be available for urgent issues outside of office hours, we are not available 24/7.   If you have an urgent issue and are unable to reach [place], you may choose to seek medical care at your doctor's office, retail clinic, urgent care center, or emergency room.  If you have a medical emergency, please immediately call 911 or go to the emergency department.  Pager Numbers  - Dr. Gwen Pounds: [phone]  -  Dr. Roseanne Reno: [phone]  - Dr. Katrinka Blazing: [phone]   In the event of inclement weather, please call our main line at [phone] for an update on the status of any delays or closures.  Dermatology Medication Tips: Please keep the boxes that topical medications come in in order to help keep track of the instructions about where and how to use these. Pharmacies typically print the medication instructions only on the boxes and not directly on the medication tubes.   If your medication is too expensive, please contact our office at [phone] option 4 or send [place] a message through MyChart.   We are unable to tell what your co-pay for medications will be in advance as this is different depending on your insurance coverage. However, we may be able to find a substitute medication at lower cost or fill out paperwork to get insurance to cover a needed medication.   If a  prior authorization is required to get your medication covered by your insurance company, please allow [place] 1-2 business days to complete this process.  Drug prices often vary depending on where the prescription is filled and some pharmacies may offer cheaper prices.  The website www.goodrx.com contains coupons for medications through different pharmacies. The prices here do not account for what the cost may be with help from insurance (it may be cheaper with your insurance), but the website can give you the price if you did not use any insurance.  - You can print the associated coupon and take it with your prescription to the pharmacy.  - You may also stop by our office during regular business hours and pick up a GoodRx coupon card.  - If you need your prescription sent electronically to a different pharmacy, notify our office through [facility] or by phone at [phone] option 4.     Si Usted Necesita Algo Despus de Su Visita  Tambin puede enviarnos un mensaje a travs de Clinical cytogeneticist. Por lo general respondemos a los mensajes de MyChart en el transcurso de 1 a 2 das hbiles.  Para renovar recetas, por favor pida a su farmacia que se ponga en contacto con nuestra oficina. Annie Sable de fax es [place] [phone].  Si tiene un asunto urgente cuando la clnica est cerrada y que no puede esperar hasta el siguiente da hbil, puede llamar/localizar a su doctor(a) al nmero que aparece a continuacin.   Por favor, tenga en cuenta que aunque hacemos todo lo posible para estar disponibles para asuntos urgentes fuera del horario de [place], no estamos disponibles las 24 horas del da, los 7 [address]  [address] de la [place].   Si tiene un problema urgente y no puede comunicarse con nosotros, puede optar por buscar atencin mdica  en el consultorio de su doctor(a), en una clnica privada, en un centro de atencin urgente o en una sala de emergencias.  Si tiene Engineer, drilling, por favor llame  inmediatamente al 911 o vaya a la sala de emergencias.  Nmeros de bper  - Dr. Gwen Pounds: [phone]  - Dra. Roseanne Reno: [phone]  - Dr. Katrinka Blazing: [phone]   En caso de inclemencias del tiempo, por favor llame a Lacy Duverney principal al [phone] para una actualizacin sobre el [place] de cualquier retraso o cierre.  Consejos para la medicacin en dermatologa: Por favor, guarde las cajas en las que vienen los medicamentos de uso tpico para ayudarle a seguir las instrucciones sobre dnde y cmo usarlos. Las farmacias generalmente imprimen las instrucciones del medicamento slo en las  cajas y no directamente en los tubos del medicamento.   Si su medicamento es muy caro, por favor, pngase en contacto con Rolm Gala llamando al [phone] y presione la opcin 4 o envenos un mensaje a travs de Clinical cytogeneticist.   No podemos decirle cul ser su copago por los medicamentos por adelantado ya que esto es diferente dependiendo de la cobertura de su seguro. Sin embargo, es posible que podamos encontrar un medicamento sustituto a Audiological scientist un formulario para que el seguro cubra el medicamento que se considera necesario.   Si se requiere una autorizacin previa para que su compaa de seguros [place] su medicamento, por favor permtanos de 1 a 2 das hbiles para completar [address]  Los precios de los medicamentos varan con frecuencia dependiendo del Environmental consultant de dnde se surte la receta y alguna farmacias pueden ofrecer precios ms baratos.  El sitio web www.goodrx.com tiene cupones para medicamentos de Health and safety inspector. Los precios aqu no tienen en cuenta lo que podra costar con la ayuda del seguro (puede ser ms barato con su seguro), pero el sitio web puede darle el precio si no utiliz Tourist information centre manager.  - Puede imprimir el cupn correspondiente y llevarlo con su receta a la farmacia.  - Tambin puede pasar por nuestra oficina durante el horario de atencin regular y  Education officer, museum una tarjeta de cupones de GoodRx.  - Si necesita que su receta se enve electrnicamente a una farmacia diferente, informe a nuestra oficina a travs de MyChart de [place] o por telfono llamando al [phone] y presione la opcin 4.

## 2023-01-04 NOTE — Progress Notes (Signed)
Follow-Up Visit   Subjective  Isabel Johnson is a 72 y.o. female who presents for the following: Skin Cancer Screening and Full Body Skin Exam  The patient presents for Total-Body Skin Exam (TBSE) for skin cancer screening and mole check. The patient has spots, moles and lesions to be evaluated, some may be new or changing and the patient may have concern these could be cancer. She has a history of BCC, SCC, and Dysplastic Nevus. The patient has rosacea and has used topicals in the past, but nothing has helped. Not currently treating.     The following portions of the chart were reviewed this encounter and updated as appropriate: medications, allergies, medical history  Review of Systems:  No other skin or systemic complaints except as noted in HPI or Assessment and Plan.  Objective  Well appearing patient in no apparent distress; mood and affect are within normal limits.  A full examination was performed including scalp, head, eyes, ears, nose, lips, neck, chest, axillae, abdomen, back, buttocks, bilateral upper extremities, bilateral lower extremities, hands, feet, fingers, toes, fingernails, and toenails. All findings within normal limits unless otherwise noted below.   Relevant physical exam findings are noted in the Assessment and Plan.    Assessment & Plan   SKIN CANCER SCREENING PERFORMED TODAY.  ACTINIC DAMAGE - Chronic condition, secondary to cumulative UV/sun exposure - diffuse scaly erythematous macules with underlying dyspigmentation - Recommend daily broad spectrum sunscreen SPF 30+ to sun-exposed areas, reapply every 2 hours as needed.  - Staying in the shade or wearing long sleeves, sun glasses (UVA+UVB protection) and wide brim hats (4-inch brim around the entire circumference of the hat) are also recommended for sun protection.  - Call for new or changing lesions.  LENTIGINES, SEBORRHEIC KERATOSES, HEMANGIOMAS - Benign normal skin lesions - Benign-appearing -  Call for any changes  MELANOCYTIC NEVI - Tan-brown and/or pink-flesh-colored symmetric macules and papules - Benign appearing on exam today - Observation - Call clinic for new or changing moles - Recommend daily use of broad spectrum spf 30+ sunscreen to sun-exposed areas.   HISTORY OF BASAL CELL CARCINOMA OF THE SKIN Left upper back at base of neck, exc 08/04/22 - No evidence of recurrence today - Recommend regular full body skin exams - Recommend daily broad spectrum sunscreen SPF 30+ to sun-exposed areas, reapply every 2 hours as needed.  - Call if any new or changing lesions are noted between office visits  HISTORY OF SQUAMOUS CELL CARCINOMA OF THE SKIN Left distal dorsum forearm, EDC 04/06/22 - No evidence of recurrence today - Recommend regular full body skin exams - Recommend daily broad spectrum sunscreen SPF 30+ to sun-exposed areas, reapply every 2 hours as needed.  - Call if any new or changing lesions are noted between office visits  HISTORY OF DYSPLASTIC NEVUS Left medial knee, exc 07/21/22 - No evidence of recurrence today - Recommend regular full body skin exams - Recommend daily broad spectrum sunscreen SPF 30+ to sun-exposed areas, reapply every 2 hours as needed.  - Call if any new or changing lesions are noted between office visits  ROSACEA Exam Mid face and chin with erythema with telangiectasias +/- scattered inflammatory papules.  Chronic and persistent condition with duration or expected duration over one year. Condition is bothersome/symptomatic for patient. Currently flared.  Rosacea is a chronic progressive skin condition usually affecting the face of adults, causing redness and/or acne bumps. It is treatable but not curable. It sometimes affects the eyes (  ocular rosacea) as well. It may respond to topical and/or systemic medication and can flare with stress, sun exposure, alcohol, exercise, topical steroids (including hydrocortisone/cortisone 10) and some  foods.  Daily application of broad spectrum spf 30+ sunscreen to face is recommended to reduce flares.  Patient denies grittiness of the eyes  Treatment Plan Start metronidazole 0.75% cream Apply to nose, cheeks, chin twice daily dsp 45g 1 yr Rf.  Return in about 6 months (around 07/05/2023) for Hx SCC, Hx BCC, Hx Dysplastic Nevus. Rosacea f/up  I, Cherlyn Labella, CMA, am acting as scribe for Willeen Niece, MD .   Documentation: I have reviewed the above documentation for accuracy and completeness, and I agree with the above.  Willeen Niece, MD

## 2023-01-05 ENCOUNTER — Encounter: Payer: Medicare Other | Admitting: Speech Pathology

## 2023-01-10 ENCOUNTER — Encounter: Payer: Medicare Other | Admitting: Speech Pathology

## 2023-01-12 ENCOUNTER — Encounter: Payer: Medicare Other | Admitting: Speech Pathology

## 2023-01-17 ENCOUNTER — Encounter: Payer: Medicare Other | Admitting: Speech Pathology

## 2023-01-19 ENCOUNTER — Encounter: Payer: Medicare Other | Admitting: Speech Pathology

## 2023-07-05 ENCOUNTER — Ambulatory Visit (INDEPENDENT_AMBULATORY_CARE_PROVIDER_SITE_OTHER): Payer: Medicare Other | Admitting: Dermatology

## 2023-07-05 DIAGNOSIS — W908XXA Exposure to other nonionizing radiation, initial encounter: Secondary | ICD-10-CM | POA: Diagnosis not present

## 2023-07-05 DIAGNOSIS — Z86018 Personal history of other benign neoplasm: Secondary | ICD-10-CM

## 2023-07-05 DIAGNOSIS — L814 Other melanin hyperpigmentation: Secondary | ICD-10-CM | POA: Diagnosis not present

## 2023-07-05 DIAGNOSIS — Z85828 Personal history of other malignant neoplasm of skin: Secondary | ICD-10-CM

## 2023-07-05 DIAGNOSIS — Z1283 Encounter for screening for malignant neoplasm of skin: Secondary | ICD-10-CM

## 2023-07-05 DIAGNOSIS — L570 Actinic keratosis: Secondary | ICD-10-CM | POA: Diagnosis not present

## 2023-07-05 DIAGNOSIS — L578 Other skin changes due to chronic exposure to nonionizing radiation: Secondary | ICD-10-CM | POA: Diagnosis not present

## 2023-07-05 DIAGNOSIS — D229 Melanocytic nevi, unspecified: Secondary | ICD-10-CM

## 2023-07-05 DIAGNOSIS — D1801 Hemangioma of skin and subcutaneous tissue: Secondary | ICD-10-CM

## 2023-07-05 DIAGNOSIS — L821 Other seborrheic keratosis: Secondary | ICD-10-CM

## 2023-07-05 DIAGNOSIS — I781 Nevus, non-neoplastic: Secondary | ICD-10-CM

## 2023-07-05 DIAGNOSIS — I8393 Asymptomatic varicose veins of bilateral lower extremities: Secondary | ICD-10-CM

## 2023-07-05 DIAGNOSIS — L719 Rosacea, unspecified: Secondary | ICD-10-CM

## 2023-07-05 DIAGNOSIS — D225 Melanocytic nevi of trunk: Secondary | ICD-10-CM

## 2023-07-05 MED ORDER — METRONIDAZOLE 0.75 % EX CREA: TOPICAL_CREAM | CUTANEOUS | 11 refills | Status: AC

## 2023-07-05 NOTE — Progress Notes (Signed)
 Follow-Up Visit   Subjective  Isabel Johnson is a 73 y.o. female who presents for the following: Skin Cancer Screening and Full Body Skin Exam  The patient presents for Total-Body Skin Exam (TBSE) for skin cancer screening and mole check. The patient has spots, moles and lesions to be evaluated, some may be new or changing and the patient may have concern these could be cancer.  The following portions of the chart were reviewed this encounter and updated as appropriate: medications, allergies, medical history  Review of Systems:  No other skin or systemic complaints except as noted in HPI or Assessment and Plan.  Objective  Well appearing patient in no apparent distress; mood and affect are within normal limits.  A full examination was performed including scalp, head, eyes, ears, nose, lips, neck, chest, axillae, abdomen, back, buttocks, bilateral upper extremities, bilateral lower extremities, hands, feet, fingers, toes, fingernails, and toenails. All findings within normal limits unless otherwise noted below.   Relevant physical exam findings are noted in the Assessment and Plan.  R nasal tip x 1, L post shoulder x 1 (2) Erythematous thin papules/macules with gritty scale.   Assessment & Plan   SKIN CANCER SCREENING PERFORMED TODAY.  ACTINIC DAMAGE - Chronic condition, secondary to cumulative UV/sun exposure - diffuse scaly erythematous macules with underlying dyspigmentation - Recommend daily broad spectrum sunscreen SPF 30+ to sun-exposed areas, reapply every 2 hours as needed.  - Staying in the shade or wearing long sleeves, sun glasses (UVA+UVB protection) and wide brim hats (4-inch brim around the entire circumference of the hat) are also recommended for sun protection.  - Call for new or changing lesions.  LENTIGINES, SEBORRHEIC KERATOSES, HEMANGIOMAS - Benign normal skin lesions - Benign-appearing - Call for any changes  MELANOCYTIC NEVI - R central upper abdomen 0.4  cm speckled brown macule. - Tan-brown and/or pink-flesh-colored symmetric macules and papules - Benign appearing on exam today - Observation - Call clinic for new or changing moles - Recommend daily use of broad spectrum spf 30+ sunscreen to sun-exposed areas.   HISTORY OF BASAL CELL CARCINOMA OF THE SKIN Left upper back at base of neck, exc 08/04/22 - No evidence of recurrence today - Recommend regular full body skin exams - Recommend daily broad spectrum sunscreen SPF 30+ to sun-exposed areas, reapply every 2 hours as needed.  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  - Call if any new or changing lesions are noted between office visits   HISTORY OF DYSPLASTIC NEVUS Left medial knee, exc 07/21/22 - No evidence of recurrence today - Recommend regular full body skin exams - Recommend daily broad spectrum sunscreen SPF 30+ to sun-exposed areas, reapply every 2 hours as needed.  - Call if any new or changing lesions are noted between office visits   ROSACEA Exam: Erythema with telangiectasias on the nose and malar cheeks.   Chronic and persistent condition with duration or expected duration over one year. Condition is improving with treatment but not currently at goal.    Rosacea is a chronic progressive skin condition usually affecting the face of adults, causing redness and/or acne bumps. It is treatable but not curable. It sometimes affects  the eyes (ocular rosacea) as well. It may respond to topical and/or systemic medication and can flare with stress, sun exposure, alcohol, exercise, topical steroids (including hydrocortisone/cortisone 10) and some foods.  Daily application of broad spectrum  spf 30+ sunscreen to face is recommended to reduce flares.   Patient denies grittiness of the eyes   Treatment Plan Continue metronidazole 0.75% cream Apply to nose, cheeks, chin twice daily.  Varicose Veins/Spider Veins - Dilated blue, purple or red veins at the lower extremities - Reassured - Smaller vessels can be treated by sclerotherapy (a procedure to inject a medicine into the veins to make them disappear) if desired, but the treatment is not covered by insurance. Larger vessels may be covered if symptomatic and we would refer to vascular surgeon if treatment desired. SKIN CANCER SCREENING   AK (ACTINIC KERATOSIS) (2) R nasal tip x 1, L post shoulder x 1 (2) Actinic keratoses are precancerous spots that appear secondary to cumulative UV radiation exposure/sun exposure over time. They are chronic with expected duration over 1 year. A portion of actinic keratoses will progress to squamous cell carcinoma of the skin. It is not possible to reliably predict which spots will progress to skin cancer and so treatment is recommended to prevent development of skin cancer.  Recommend daily broad spectrum sunscreen SPF 30+ to sun-exposed areas, reapply every 2 hours as needed.  Recommend staying in the shade or wearing long sleeves, sun glasses (UVA+UVB protection) and wide brim hats (4-inch brim around the entire circumference of the hat). Call for new or changing lesions.  Destruction of lesion - R nasal tip x 1, L post shoulder x 1 (2)  Destruction method: cryotherapy   Informed consent: discussed and consent obtained   Lesion destroyed using liquid nitrogen: Yes   Region frozen until ice ball extended beyond lesion: Yes   Outcome: patient tolerated procedure well with no complications   Post-procedure details: wound care instructions given   Additional details:  Prior to procedure, discussed risks of blister formation, small wound, skin dyspigmentation, or rare scar following  cryotherapy. Recommend Vaseline ointment to treated areas while healing.  ACTINIC SKIN DAMAGE   LENTIGO   SEBORRHEIC KERATOSIS   HEMANGIOMA OF SKIN   NEVUS   HISTORY OF BASAL CELL CARCINOMA (BCC) OF SKIN   HISTORY OF SCC (SQUAMOUS CELL CARCINOMA) OF SKIN   HISTORY OF DYSPLASTIC NEVUS   ROSACEA   ASYMPTOMATIC VARICOSE VEINS OF BOTH LOWER EXTREMITIES   Return in about 6 months (around 01/04/2024) for Ak f/up.  Maylene Roes, CMA, am acting as scribe for Willeen Niece, MD .  Documentation: I have reviewed the above documentation for accuracy and completeness, and I agree with the above.  Willeen Niece, MD

## 2023-07-05 NOTE — Patient Instructions (Signed)

## 2023-08-03 ENCOUNTER — Other Ambulatory Visit: Payer: Self-pay | Admitting: Internal Medicine

## 2023-08-03 DIAGNOSIS — Z1231 Encounter for screening mammogram for malignant neoplasm of breast: Secondary | ICD-10-CM

## 2023-09-05 ENCOUNTER — Ambulatory Visit
Admission: RE | Admit: 2023-09-05 | Discharge: 2023-09-05 | Disposition: A | Discharge status: Home / Self Care | Source: Ambulatory Visit | Attending: Internal Medicine | Admitting: Internal Medicine

## 2023-09-05 DIAGNOSIS — Z1231 Encounter for screening mammogram for malignant neoplasm of breast: Secondary | ICD-10-CM | POA: Insufficient documentation

## 2024-07-24 ENCOUNTER — Encounter: Admitting: Dermatology
